# Patient Record
Sex: Female | Born: 1950
Health system: Southern US, Community
[De-identification: ages and names within clinical notes are randomized; demographics above are authoritative.]

## PROBLEM LIST (undated history)

## (undated) DIAGNOSIS — K5792 Diverticulitis of intestine, part unspecified, without perforation or abscess without bleeding: Secondary | ICD-10-CM

## (undated) HISTORY — PX: TARSAL TUNNEL RELEASE: SHX5042

---

## 1986-05-16 HISTORY — PX: OTHER SURGICAL HISTORY: SHX169

## 2003-08-12 ENCOUNTER — Encounter: Admission: RE | Admit: 2003-08-12 | Discharge: 2003-08-12 | Payer: Self-pay | Admitting: Orthopedic Surgery

## 2003-10-30 ENCOUNTER — Emergency Department (HOSPITAL_COMMUNITY): Admission: EM | Admit: 2003-10-30 | Discharge: 2003-10-30 | Payer: Self-pay | Admitting: Emergency Medicine

## 2004-06-03 ENCOUNTER — Ambulatory Visit (HOSPITAL_BASED_OUTPATIENT_CLINIC_OR_DEPARTMENT_OTHER): Admission: RE | Admit: 2004-06-03 | Discharge: 2004-06-03 | Payer: Self-pay | Admitting: Orthopedic Surgery

## 2013-09-07 ENCOUNTER — Ambulatory Visit: Payer: BC Managed Care – PPO | Admitting: Family Medicine

## 2013-09-07 VITALS — BP 130/84 | HR 79 | Temp 99.0°F | Resp 18 | Ht 65.0 in | Wt 177.6 lb

## 2013-09-07 DIAGNOSIS — Z8719 Personal history of other diseases of the digestive system: Secondary | ICD-10-CM | POA: Insufficient documentation

## 2013-09-07 DIAGNOSIS — K5792 Diverticulitis of intestine, part unspecified, without perforation or abscess without bleeding: Secondary | ICD-10-CM

## 2013-09-07 DIAGNOSIS — R109 Unspecified abdominal pain: Secondary | ICD-10-CM

## 2013-09-07 LAB — POCT CBC
Granulocyte percent: 65.8 %G (ref 37–80)
HCT, POC: 46 % (ref 37.7–47.9)
Hemoglobin: 15 g/dL (ref 12.2–16.2)
Lymph, poc: 2.3 (ref 0.6–3.4)
MCH: 28.8 pg (ref 27–31.2)
MCHC: 32.6 g/dL (ref 31.8–35.4)
MCV: 88.3 fL (ref 80–97)
MID (CBC): 0.5 (ref 0–0.9)
MPV: 10 fL (ref 0–99.8)
PLATELET COUNT, POC: 338 10*3/uL (ref 142–424)
POC Granulocyte: 5.5 (ref 2–6.9)
POC LYMPH %: 28.2 % (ref 10–50)
POC MID %: 6 %M (ref 0–12)
RBC: 5.21 M/uL (ref 4.04–5.48)
RDW, POC: 12.7 %
WBC: 8.3 10*3/uL (ref 4.6–10.2)

## 2013-09-07 LAB — POCT URINALYSIS DIPSTICK
Blood, UA: NEGATIVE
Glucose, UA: NEGATIVE
LEUKOCYTES UA: NEGATIVE
NITRITE UA: NEGATIVE
PROTEIN UA: 30
Spec Grav, UA: >=1.03
UROBILINOGEN UA: 1
pH, UA: 5.5

## 2013-09-07 LAB — POCT UA - MICROSCOPIC ONLY
Bacteria, U Microscopic: NEGATIVE
Casts, Ur, LPF, POC: NEGATIVE
Crystals, Ur, HPF, POC: NEGATIVE
YEAST UA: NEGATIVE

## 2013-09-07 MED ORDER — METRONIDAZOLE 500 MG PO TABS: 500.0000 mg | ORAL_TABLET | Freq: Two times a day (BID) | ORAL | Status: DC

## 2013-09-07 MED ORDER — CIPROFLOXACIN HCL 500 MG PO TABS: 500.0000 mg | ORAL_TABLET | Freq: Two times a day (BID) | ORAL | Status: DC

## 2013-09-07 NOTE — Progress Notes (Addendum)
Subjective:    Patient ID: Katie Blake, female    DOB: August 11, 1950, 63 y.o.   MRN: [id number]  Abdominal Pain Associated symptoms include constipation, dysuria and a fever. Pertinent negatives include no nausea or vomiting.   Chief Complaint  Patient presents with  . Abdominal Pain    lower, 1 week, getting worse    This chart was scribed for Katie StaggersJeffrey Latorya Bautch, MD by Katie Blake, ED Scribe. This patient was seen in room 11 and the patient's care was started at 6:00 PM.  HPI Comments: Katie Blake is a 63 y.o. female who presents to the [facility] complaining of worsening, left lower abdominal pain onset 1 week.She reports that the pain had gotten better 4 days ago but worsened in last 2-3 days. Pt reports fever, hot and cold symptoms, decreased appetite, minor dysuria, and constipation. She reports that she has had a very Blake BM a couple of days. Pt reports taking tylenol. She denies emesis and nausea. She reports that she had a  UTI many years ago but her symptoms today do not feel like UTI symptoms. Pt reports h/o diverticulitis. Pt reports that her symptoms feels similar. Pt denies colonoscpy.  There are no active problems to display for this patient.  History reviewed. No pertinent past medical history. History reviewed. No pertinent past surgical history. Allergies  Allergen Reactions  . Sulfa Antibiotics    Prior to Admission medications   Not on File   History   Social History  . Marital Status: Single    Spouse Name: N/A    Number of Children: N/A  . Years of Education: N/A   Occupational History  . Not on file.   Social History Main Topics  . Smoking status: Never Smoker   . Smokeless tobacco: Not on file  . Alcohol Use: Not on file  . Drug Use: Not on file  . Sexual Activity: Not on file   Other Topics Concern  . Not on file   Social History Narrative  . No narrative on file   Review of Systems  Constitutional: Positive for fever,  chills and appetite change.  Gastrointestinal: Positive for abdominal pain and constipation. Negative for nausea and vomiting.  Genitourinary: Positive for dysuria. Negative for difficulty urinating.       Objective:   Physical Exam  Nursing note and vitals reviewed. Constitutional: She is oriented to person, place, and time. She appears well-developed and well-nourished. No distress.  HENT:  Head: Normocephalic and atraumatic.  Eyes: EOM are normal.  Neck: Neck supple. No tracheal deviation present.  Cardiovascular: Normal rate, regular rhythm and normal heart sounds.  Exam reveals no gallop and no friction rub.   No murmur heard. Pulmonary/Chest: Effort normal and breath sounds normal. No respiratory distress. She has no wheezes. She has no rales.  Abdominal: Bowel sounds are normal. There is tenderness in the left lower quadrant. There is guarding ( LLQ). There is no CVA tenderness, no tenderness at McBurney's point and negative Murphy's sign.  Musculoskeletal: Normal range of motion.  Lumbar spine tenderness  Neurological: She is alert and oriented to person, place, and time.  Skin: Skin is warm, dry and intact. No rash noted.  Psychiatric: She has a normal mood and affect. Her behavior is normal.    Results for orders placed in visit on 09/07/13  POCT URINALYSIS DIPSTICK      Result Value Ref Range   Color, UA yellow  Clarity, UA clear     Glucose, UA neg     Bilirubin, UA Blake     Ketones, UA trace     Spec Grav, UA >=1.030     Blood, UA neg     pH, UA 5.5     Protein, UA 30     Urobilinogen, UA 1.0     Nitrite, UA neg     Leukocytes, UA Negative    POCT UA - MICROSCOPIC ONLY      Result Value Ref Range   WBC, Ur, HPF, POC 0-2     RBC, urine, microscopic 0-1     Bacteria, U Microscopic neg     Mucus, UA trace     Epithelial cells, urine per micros 0-4     Crystals, Ur, HPF, POC neg     Casts, Ur, LPF, POC neg     Yeast, UA neg    POCT CBC      Result Value  Ref Range   WBC 8.3  4.6 - 10.2 K/uL   Lymph, poc 2.3  0.6 - 3.4   POC LYMPH PERCENT 28.2  10 - 50 %L   MID (cbc) 0.5  0 - 0.9   POC MID % 6.0  0 - 12 %M   POC Granulocyte 5.5  2 - 6.9   Granulocyte percent 65.8  37 - 80 %G   RBC 5.21  4.04 - 5.48 M/uL   Hemoglobin 15.0  12.2 - 16.2 g/dL   HCT, POC 16.1  09.6 - 47.9 %   MCV 88.3  80 - 97 fL   MCH, POC 28.8  27 - 31.2 pg   MCHC 32.6  31.8 - 35.4 g/dL   RDW, POC 04.5     Platelet Count, POC 338  142 - 424 K/uL   MPV 10.0  0 - 99.8 fL       Assessment & Plan:   Katie Blake is a 63 y.o. female Abdominal pain - Plan: POCT urinalysis dipstick, POCT UA - Microscopic Only, POCT CBC, ciprofloxacin (CIPRO) 500 MG tablet, metroNIDAZOLE (FLAGYL) 500 MG tablet  Diverticulitis - Plan: ciprofloxacin (CIPRO) 500 MG tablet, metroNIDAZOLE (FLAGYL) 500 MG tablet Suspected early/mild diverticulitis. Start cipro/flagyl and recheck in next 48-72hrs if not much improved as may need CT. Sooner if fever, incr pain or other worsening (RTC/ER precautions given).   Meds ordered this encounter  Medications  . ciprofloxacin (CIPRO) 500 MG tablet    Sig: Take 1 tablet (500 mg total) by mouth 2 (two) times daily.    Dispense:  20 tablet    Refill:  0  . metroNIDAZOLE (FLAGYL) 500 MG tablet    Sig: Take 1 tablet (500 mg total) by mouth 2 (two) times daily.    Dispense:  20 tablet    Refill:  0   Patient Instructions  Start the antibiotics as discussed for probable diverticulitis. If not improving in the next 48-72 hours - return for recheck as we may need to obtain a cat scan to further evaluate your symptoms. Return to the clinic or go to the nearest emergency room if any of your symptoms worsen or new symptoms occur, including but not limited to fever, vomiting or worsening abdominal pain.   After the current symptoms improve - follow up with primary care provider or gastroenterologist.   Diverticulitis A diverticulum is a Blake pouch or sac on the  colon. Diverticulosis is the presence of these diverticula on the colon.  Diverticulitis is the irritation (inflammation) or infection of diverticula. CAUSES  The colon and its diverticula contain bacteria. If food particles block the tiny opening to a diverticulum, the bacteria inside can grow and cause an increase in pressure. This leads to infection and inflammation and is called diverticulitis. SYMPTOMS   Abdominal pain and tenderness. Usually, the pain is located on the left side of your abdomen. However, it could be located elsewhere.  Fever.  Bloating.  Feeling sick to your stomach (nausea).  Throwing up (vomiting).  Abnormal stools. DIAGNOSIS  Your caregiver will take a history and perform a physical exam. Since many things can cause abdominal pain, other tests may be necessary. Tests may include:  Blood tests.  Urine tests.  X-ray of the abdomen.  CT scan of the abdomen. Sometimes, surgery is needed to determine if diverticulitis or other conditions are causing your symptoms. TREATMENT  Most of the time, you can be treated without surgery. Treatment includes:  Resting the bowels by only having liquids for a few days. As you improve, you will need to eat a low-fiber diet.  Intravenous (IV) fluids if you are losing body fluids (dehydrated).  Antibiotic medicines that treat infections may be given.  Pain and nausea medicine, if needed.  Surgery if the inflamed diverticulum has burst. HOME CARE INSTRUCTIONS   Try a clear liquid diet (broth, tea, or water for as long as directed by your caregiver). You may then gradually begin a low-fiber diet as tolerated.  A low-fiber diet is a diet with less than 10 grams of fiber. Choose the foods below to reduce fiber in the diet:  White breads, cereals, rice, and pasta.  Cooked fruits and vegetables or soft fresh fruits and vegetables without the skin.  Ground or well-cooked tender beef, ham, veal, lamb, pork, or  poultry.  Eggs and seafood.  After your diverticulitis symptoms have improved, your caregiver may put you on a high-fiber diet. A high-fiber diet includes 14 grams of fiber for every 1000 calories consumed. For a standard 2000 calorie diet, you would need 28 grams of fiber. Follow these diet guidelines to help you increase the fiber in your diet. It is important to slowly increase the amount fiber in your diet to avoid gas, constipation, and bloating.  Choose whole-grain breads, cereals, pasta, and brown rice.  Choose fresh fruits and vegetables with the skin on. Do not overcook vegetables because the more vegetables are cooked, the more fiber is lost.  Choose more nuts, seeds, legumes, dried peas, beans, and lentils.  Look for food products that have greater than 3 grams of fiber per serving on the Nutrition Facts label.  Take all medicine as directed by your caregiver.  If your caregiver has given you a follow-up appointment, it is very important that you go. Not going could result in lasting (chronic) or permanent injury, pain, and disability. If there is any problem keeping the appointment, call to reschedule. SEEK MEDICAL CARE IF:   Your pain does not improve.  You have a hard time advancing your diet beyond clear liquids.  Your bowel movements do not return to normal. SEEK IMMEDIATE MEDICAL CARE IF:   Your pain becomes worse.  You have an oral temperature above 102 F (38.9 C), not controlled by medicine.  You have repeated vomiting.  You have bloody or black, tarry stools.  Symptoms that brought you to your caregiver become worse or are not getting better. MAKE SURE YOU:   Understand these instructions.  Will watch your condition.  Will get help right away if you are not doing well or get worse. Document Released: 02/09/2005 Document Revised: 07/25/2011 Document Reviewed: 06/07/2010 [facility] Patient Information 2014 [place], [place].       I personally  performed the services described in this documentation, which was scribed in my presence. The recorded information has been reviewed and considered, and addended by me as needed.

## 2013-09-07 NOTE — Patient Instructions (Signed)
Start the antibiotics as discussed for probable diverticulitis. If not improving in the next 48-72 hours - return for recheck as we may need to obtain a cat scan to further evaluate your symptoms. Return to the clinic or go to the nearest emergency room if any of your symptoms worsen or new symptoms occur, including but not limited to fever, vomiting or worsening abdominal pain.   After the current symptoms improve - follow up with primary care provider or gastroenterologist.   Diverticulitis A diverticulum is a small pouch or sac on the colon. Diverticulosis is the presence of these diverticula on the colon. Diverticulitis is the irritation (inflammation) or infection of diverticula. CAUSES  The colon and its diverticula contain bacteria. If food particles block the tiny opening to a diverticulum, the bacteria inside can grow and cause an increase in pressure. This leads to infection and inflammation and is called diverticulitis. SYMPTOMS   Abdominal pain and tenderness. Usually, the pain is located on the left side of your abdomen. However, it could be located elsewhere.  Fever.  Bloating.  Feeling sick to your stomach (nausea).  Throwing up (vomiting).  Abnormal stools. DIAGNOSIS  Your caregiver will take a history and perform a physical exam. Since many things can cause abdominal pain, other tests may be necessary. Tests may include:  Blood tests.  Urine tests.  X-ray of the abdomen.  CT scan of the abdomen. Sometimes, surgery is needed to determine if diverticulitis or other conditions are causing your symptoms. TREATMENT  Most of the time, you can be treated without surgery. Treatment includes:  Resting the bowels by only having liquids for a few days. As you improve, you will need to eat a low-fiber diet.  Intravenous (IV) fluids if you are losing body fluids (dehydrated).  Antibiotic medicines that treat infections may be given.  Pain and nausea medicine, if  needed.  Surgery if the inflamed diverticulum has burst. HOME CARE INSTRUCTIONS   Try a clear liquid diet (broth, tea, or water for as long as directed by your caregiver). You may then gradually begin a low-fiber diet as tolerated.  A low-fiber diet is a diet with less than 10 grams of fiber. Choose the foods below to reduce fiber in the diet:  White breads, cereals, rice, and pasta.  Cooked fruits and vegetables or soft fresh fruits and vegetables without the skin.  Ground or well-cooked tender beef, ham, veal, lamb, pork, or poultry.  Eggs and seafood.  After your diverticulitis symptoms have improved, your caregiver may put you on a high-fiber diet. A high-fiber diet includes 14 grams of fiber for every 1000 calories consumed. For a standard 2000 calorie diet, you would need 28 grams of fiber. Follow these diet guidelines to help you increase the fiber in your diet. It is important to slowly increase the amount fiber in your diet to avoid gas, constipation, and bloating.  Choose whole-grain breads, cereals, pasta, and brown rice.  Choose fresh fruits and vegetables with the skin on. Do not overcook vegetables because the more vegetables are cooked, the more fiber is lost.  Choose more nuts, seeds, legumes, dried peas, beans, and lentils.  Look for food products that have greater than 3 grams of fiber per serving on the Nutrition Facts label.  Take all medicine as directed by your caregiver.  If your caregiver has given you a follow-up appointment, it is very important that you go. Not going could result in lasting (chronic) or permanent injury, pain, and  disability. If there is any problem keeping the appointment, call to reschedule. SEEK MEDICAL CARE IF:   Your pain does not improve.  You have a hard time advancing your diet beyond clear liquids.  Your bowel movements do not return to normal. SEEK IMMEDIATE MEDICAL CARE IF:   Your pain becomes worse.  You have an oral  temperature above 102 F (38.9 C), not controlled by medicine.  You have repeated vomiting.  You have bloody or black, tarry stools.  Symptoms that brought you to your caregiver become worse or are not getting better. MAKE SURE YOU:   Understand these instructions.  Will watch your condition.  Will get help right away if you are not doing well or get worse. Document Released: 02/09/2005 Document Revised: 07/25/2011 Document Reviewed: 06/07/2010 [facility] Patient Information 2014 [place], [place].

## 2013-09-09 ENCOUNTER — Ambulatory Visit: Payer: BC Managed Care – PPO

## 2013-09-17 ENCOUNTER — Ambulatory Visit: Payer: BC Managed Care – PPO

## 2013-09-17 ENCOUNTER — Other Ambulatory Visit: Payer: Self-pay | Admitting: Family Medicine

## 2013-09-17 ENCOUNTER — Ambulatory Visit: Payer: BC Managed Care – PPO | Admitting: Family Medicine

## 2013-09-17 VITALS — BP 119/78 | HR 81 | Temp 98.7°F | Resp 16 | Ht 65.5 in | Wt 173.0 lb

## 2013-09-17 DIAGNOSIS — R109 Unspecified abdominal pain: Secondary | ICD-10-CM

## 2013-09-17 DIAGNOSIS — R197 Diarrhea, unspecified: Secondary | ICD-10-CM

## 2013-09-17 DIAGNOSIS — R110 Nausea: Secondary | ICD-10-CM

## 2013-09-17 LAB — POCT CBC
Granulocyte percent: 65.8 % (ref 37–80)
HCT, POC: 42 % (ref 37.7–47.9)
Hemoglobin: 13.8 g/dL (ref 12.2–16.2)
Lymph, poc: 2.1 (ref 0.6–3.4)
MCH, POC: 28.8 pg (ref 27–31.2)
MCHC: 32.9 g/dL (ref 31.8–35.4)
MCV: 87.5 fL (ref 80–97)
MID (cbc): 0.6 (ref 0–0.9)
MPV: 10.1 fL (ref 0–99.8)
POC Granulocyte: 5.2 (ref 2–6.9)
POC LYMPH PERCENT: 26.9 % (ref 10–50)
POC MID %: 7.3 %M (ref 0–12)
Platelet Count, POC: 378 10*3/uL (ref 142–424)
RBC: 4.8 M/uL (ref 4.04–5.48)
RDW, POC: 13.4 %
WBC: 7.9 10*3/uL (ref 4.6–10.2)

## 2013-09-17 LAB — COMPREHENSIVE METABOLIC PANEL
BUN: 13 mg/dL (ref 6–23)
CO2: 27 mEq/L (ref 19–32)
Calcium: 9 mg/dL (ref 8.4–10.5)
Chloride: 104 mEq/L (ref 96–112)
Creat: 0.77 mg/dL (ref 0.50–1.10)
Total Bilirubin: 0.5 mg/dL (ref 0.2–1.2)

## 2013-09-17 LAB — COMPREHENSIVE METABOLIC PANEL WITH GFR
ALT: 9 U/L (ref 0–35)
AST: 11 U/L (ref 0–37)
Albumin: 3.8 g/dL (ref 3.5–5.2)
Alkaline Phosphatase: 59 U/L (ref 39–117)
Glucose, Bld: 107 mg/dL — ABNORMAL HIGH (ref 70–99)
Potassium: 3.8 meq/L (ref 3.5–5.3)
Sodium: 141 meq/L (ref 135–145)
Total Protein: 7 g/dL (ref 6.0–8.3)

## 2013-09-17 MED ORDER — ONDANSETRON HCL 4 MG PO TABS: 4.0000 mg | ORAL_TABLET | Freq: Three times a day (TID) | ORAL | Status: DC | PRN

## 2013-09-17 NOTE — Progress Notes (Signed)
Chief Complaint:  Chief Complaint  Patient presents with  . Abdominal Pain    was treated for diverticulitis, finished abx today.    HPI: Katie Blake is a 63 y.o. female who is here for  Recheck on diverticular disease, was having LLQ abd pain and was seen here on 09/07/13, she has been on cipro and flagyl BID x 10 days, still has sxs but abd more diffuse  She was dx with presumptive diverticulitis/losis. She ate popcorn around that time, which she normally never does. She started having diarrhea once she took medicine, yesterday 2 episodes, today abput 4.N onbloody loose stools.  She was seen in [place] years back and was dx with diverticulitis and was gien abx and went away in 2 days, This time it has not gone away.  Can't eat or drink,  Poor po intake, can get nauseated, has not vomitted, drinksregular coke and feels better She denies diarrhea with her prior diverticular  flare ups.  Last OV, CBC and UA WNL No prior CT scan, no prior colonscopies No prior abdominal surgeries She was dx clinically with diverticualr dz  No past medical history on file. No past surgical history on file. History   Social History  . Marital Status: Single    Spouse Name: N/A    Number of Children: N/A  . Years of Education: N/A   Social History Main Topics  . Smoking status: Never Smoker   . Smokeless tobacco: Not on file  . Alcohol Use: Not on file  . Drug Use: Not on file  . Sexual Activity: Not on file   Other Topics Concern  . Not on file   Social History Narrative  . No narrative on file   Family History  Problem Relation Age of Onset  . Diabetes Mother   . Cancer Father    Allergies  Allergen Reactions  . Sulfa Antibiotics    Prior to Admission medications   Medication Sig Start Date End Date Taking? Authorizing Provider  ciprofloxacin (CIPRO) 500 MG tablet Take 1 tablet (500 mg total) by mouth 2 (two) times daily. 09/07/13   Shade FloodJeffrey R Greene, MD     ROS: The  patient denies fevers, chills, night sweats, unintentional weight loss, chest pain, palpitations, wheezing, dyspnea on exertion, nausea, vomiting, abdominal pain, dysuria, hematuria, melena, numbness, or tingling.   All other systems have been reviewed and were otherwise negative with the exception of those mentioned in the HPI and as above.    PHYSICAL EXAM: Filed Vitals:   09/17/13 1651  BP: 119/78  Pulse: 81  Temp: 98.7 F (37.1 C)  Resp: 16   Filed Vitals:   09/17/13 1651  Height: 5' 5.5" (1.664 m)  Weight: 173 lb (78.472 kg)   Body mass index is 28.34 kg/(m^2).  General: Alert, no acute distress HEENT:  Normocephalic, atraumatic, oropharynx patent. EOMI, PERRLA Cardiovascular:  Regular rate and rhythm, no rubs murmurs or gallops.  No Carotid bruits, radial pulse intact. No pedal edema.  Respiratory: Clear to auscultation bilaterally.  No wheezes, rales, or rhonchi.  No cyanosis, no use of accessory musculature GI: No organomegaly, abdomen is soft and non-tender, positive bowel sounds.  No masses. Skin: No rashes. Neurologic: Facial musculature symmetric. Psychiatric: Patient is appropriate throughout our interaction. Lymphatic: No cervical lymphadenopathy Musculoskeletal: Gait intact.   LABS: Results for orders placed in visit on 09/17/13  POCT CBC      Result Value Ref Range  WBC 7.9  4.6 - 10.2 K/uL   Lymph, poc 2.1  0.6 - 3.4   POC LYMPH PERCENT 26.9  10 - 50 %L   MID (cbc) 0.6  0 - 0.9   POC MID % 7.3  0 - 12 %M   POC Granulocyte 5.2  2 - 6.9   Granulocyte percent 65.8  37 - 80 %G   RBC 4.80  4.04 - 5.48 M/uL   Hemoglobin 13.8  12.2 - 16.2 g/dL   HCT, POC 16.142.0  09.637.7 - 47.9 %   MCV 87.5  80 - 97 fL   MCH, POC 28.8  27 - 31.2 pg   MCHC 32.9  31.8 - 35.4 g/dL   RDW, POC 04.513.4     Platelet Count, POC 378  142 - 424 K/uL   MPV 10.1  0 - 99.8 fL     EKG/XRAY:   [facility] Nonspecific gas patterns + pepto use No acute  cardiompulmoary process   ASSESSMENT/PLAN: Encounter Diagnoses  Name Primary?  . Diarrhea Yes  . Nausea alone   . Abdominal pain, unspecified site    Rx Zofran for nausea, may help with loose stools as well Await C diff and CMP She decline CT scan or referral for screening colonoscopy referral F/u prn  Gross sideeffects, risk and benefits, and alternatives of medications d/w patient. Patient is aware that all medications have potential sideeffects and we are unable to predict every sideeffect or drug-drug interaction that may occur.  Lenell Antuhao P Le, DO 09/17/2013 5:41 PM

## 2013-09-17 NOTE — Patient Instructions (Signed)
Abdominal Pain, Adult °Many things can cause abdominal pain. Usually, abdominal pain is not caused by a disease and will improve without treatment. It can often be observed and treated at home. Your health care provider will do a physical exam and possibly order blood tests and X-rays to help determine the seriousness of your pain. However, in many cases, more time must pass before a clear cause of the pain can be found. Before that point, your health care provider may not know if you need more testing or further treatment. °HOME CARE INSTRUCTIONS  °Monitor your abdominal pain for any changes. The following actions may help to alleviate any discomfort you are experiencing: °· Only take over-the-counter or prescription medicines as directed by your health care provider. °· Do not take laxatives unless directed to do so by your health care provider. °· Try a clear liquid diet (broth, tea, or water) as directed by your health care provider. Slowly move to a bland diet as tolerated. °SEEK MEDICAL CARE IF: °· You have unexplained abdominal pain. °· You have abdominal pain associated with nausea or diarrhea. °· You have pain when you urinate or have a bowel movement. °· You experience abdominal pain that wakes you in the night. °· You have abdominal pain that is worsened or improved by eating food. °· You have abdominal pain that is worsened with eating fatty foods. °SEEK IMMEDIATE MEDICAL CARE IF:  °· Your pain does not go away within 2 hours. °· You have a fever. °· You keep throwing up (vomiting). °· Your pain is felt only in portions of the abdomen, such as the right side or the left lower portion of the abdomen. °· You pass bloody or black tarry stools. °MAKE SURE YOU: °· Understand these instructions.   °· Will watch your condition.   °· Will get help right away if you are not doing well or get worse.   °Document Released: 02/09/2005 Document Revised: 02/20/2013 Document Reviewed: 01/09/2013 °ExitCare® Patient  Information ©2014 [employer]. ° °

## 2013-09-18 ENCOUNTER — Telehealth: Payer: Self-pay | Admitting: Family Medicine

## 2013-09-18 LAB — C. DIFFICILE GDH AND TOXIN A/B
C. difficile GDH: NOT DETECTED
C. difficile Toxin A/B: NOT DETECTED

## 2013-09-18 NOTE — Telephone Encounter (Signed)
Spoke to patient about labs, feels better, no diarrhea since on zofran, able to take in po

## 2013-09-26 ENCOUNTER — Encounter: Payer: Self-pay | Admitting: Family Medicine

## 2013-10-06 ENCOUNTER — Telehealth: Payer: Self-pay

## 2013-10-06 ENCOUNTER — Ambulatory Visit: Payer: BC Managed Care – PPO | Admitting: Family Medicine

## 2013-10-06 VITALS — BP 112/74 | HR 70 | Temp 98.5°F | Resp 16 | Ht 65.5 in | Wt 171.0 lb

## 2013-10-06 DIAGNOSIS — K5792 Diverticulitis of intestine, part unspecified, without perforation or abscess without bleeding: Secondary | ICD-10-CM

## 2013-10-06 DIAGNOSIS — K5732 Diverticulitis of large intestine without perforation or abscess without bleeding: Secondary | ICD-10-CM

## 2013-10-06 LAB — POCT CBC
Granulocyte percent: 64.7 %G (ref 37–80)
HCT, POC: 41.6 % (ref 37.7–47.9)
Hemoglobin: 13.6 g/dL (ref 12.2–16.2)
Lymph, poc: 2.4 (ref 0.6–3.4)
MCH, POC: 28.4 pg (ref 27–31.2)
MCHC: 32.7 g/dL (ref 31.8–35.4)
MCV: 86.9 fL (ref 80–97)
MID (cbc): 0.4 (ref 0–0.9)
MPV: 9.8 fL (ref 0–99.8)
POC Granulocyte: 5.2 (ref 2–6.9)
POC LYMPH PERCENT: 30.2 %L (ref 10–50)
POC MID %: 5.1 %M (ref 0–12)
Platelet Count, POC: 238 10*3/uL (ref 142–424)
RBC: 4.79 M/uL (ref 4.04–5.48)
RDW, POC: 13.4 %
WBC: 8 10*3/uL (ref 4.6–10.2)

## 2013-10-06 MED ORDER — AMOXICILLIN-POT CLAVULANATE 875-125 MG PO TABS: 1.0000 | ORAL_TABLET | Freq: Two times a day (BID) | ORAL | Status: DC

## 2013-10-06 NOTE — Telephone Encounter (Signed)
PATIENT IS CALLING TO SEE IF SHE CAN TRY A DIFFERENT MEDICINE WITHOUT HAVING TO MAKE AN OFFICE VISIT. SHE IS CURRENTLY TAKING CIPROFLOXAVIN AND METRONODAZOLE, WHICH GIVES HER DIARREAH, AND ONDANSETRON WHICH STOPS THE DIARRHEA BUT DOESN'T HELP WITH ANYTHING ELSE.  PLEASE CALL PATIENT: [phone]

## 2013-10-06 NOTE — Progress Notes (Signed)
   Subjective:    Patient ID: Katie Blake, female    DOB: [birth year], 63 y.o.   MRN: [id number]  HPI 63 yo woman with left lower quadrant abdominal pain x 4-6 weeks, initally given cipro and flagyl, then zofran.  She's had some diarrhea lately (last week) and the loss of appetite is really bothering her  Patient's been sick for 2 months. She's not been able to eat very well although at times the nausea is less than others. She's been having increasing abdominal pain in the left lower quadrant over the last 48 hours and intensity is becoming unbearable.  Patient remains nauseated.   Review of Systems     Objective:   Physical Exam no acute distress HEENT: Unremarkable Chest: Clear  Heart: Regular Abdomen: Soft, guarding left lower quadrant along with rebound to that left lower quadrant as well. No mass palpable, no HSM Skin: No jaundice, no rashes Extremities: No Results for orders placed in visit on 09/17/13  C. DIFFICILE GDH AND TOXIN A/B      Result Value Ref Range   C. difficile GDH Not Detected     C. difficile Toxin A/B Not Detected     Source Stool     Wt Readings from Last 3 Encounters:  10/06/13 171 lb (77.565 kg)  09/17/13 173 lb (78.472 kg)  09/07/13 177 lb 9.6 oz (80.559 kg)        Assessment & Plan:

## 2013-10-06 NOTE — Patient Instructions (Addendum)
I want you to take probiotics twice daily to prevent antibiotic associated diarrhea   Diverticulitis A diverticulum is a small pouch or sac on the colon. Diverticulosis is the presence of these diverticula on the colon. Diverticulitis is the irritation (inflammation) or infection of diverticula. CAUSES  The colon and its diverticula contain bacteria. If food particles block the tiny opening to a diverticulum, the bacteria inside can grow and cause an increase in pressure. This leads to infection and inflammation and is called diverticulitis. SYMPTOMS   Abdominal pain and tenderness. Usually, the pain is located on the left side of your abdomen. However, it could be located elsewhere.  Fever.  Bloating.  Feeling sick to your stomach (nausea).  Throwing up (vomiting).  Abnormal stools. DIAGNOSIS  Your caregiver will take a history and perform a physical exam. Since many things can cause abdominal pain, other tests may be necessary. Tests may include:  Blood tests.  Urine tests.  X-ray of the abdomen.  CT scan of the abdomen. Sometimes, surgery is needed to determine if diverticulitis or other conditions are causing your symptoms. TREATMENT  Most of the time, you can be treated without surgery. Treatment includes:  Resting the bowels by only having liquids for a few days. As you improve, you will need to eat a low-fiber diet.  Intravenous (IV) fluids if you are losing body fluids (dehydrated).  Antibiotic medicines that treat infections may be given.  Pain and nausea medicine, if needed.  Surgery if the inflamed diverticulum has burst. HOME CARE INSTRUCTIONS   Try a clear liquid diet (broth, tea, or water for as long as directed by your caregiver). You may then gradually begin a low-fiber diet as tolerated.  A low-fiber diet is a diet with less than 10 grams of fiber. Choose the foods below to reduce fiber in the diet:  White breads, cereals, rice, and pasta.  Cooked  fruits and vegetables or soft fresh fruits and vegetables without the skin.  Ground or well-cooked tender beef, ham, veal, lamb, pork, or poultry.  Eggs and seafood.  After your diverticulitis symptoms have improved, your caregiver may put you on a high-fiber diet. A high-fiber diet includes 14 grams of fiber for every 1000 calories consumed. For a standard 2000 calorie diet, you would need 28 grams of fiber. Follow these diet guidelines to help you increase the fiber in your diet. It is important to slowly increase the amount fiber in your diet to avoid gas, constipation, and bloating.  Choose whole-grain breads, cereals, pasta, and brown rice.  Choose fresh fruits and vegetables with the skin on. Do not overcook vegetables because the more vegetables are cooked, the more fiber is lost.  Choose more nuts, seeds, legumes, dried peas, beans, and lentils.  Look for food products that have greater than 3 grams of fiber per serving on the Nutrition Facts label.  Take all medicine as directed by your caregiver.  If your caregiver has given you a follow-up appointment, it is very important that you go. Not going could result in lasting (chronic) or permanent injury, pain, and disability. If there is any problem keeping the appointment, call to reschedule. SEEK MEDICAL CARE IF:   Your pain does not improve.  You have a hard time advancing your diet beyond clear liquids.  Your bowel movements do not return to normal. SEEK IMMEDIATE MEDICAL CARE IF:   Your pain becomes worse.  You have an oral temperature above 102 F (38.9 C), not controlled by  medicine.  You have repeated vomiting.  You have bloody or black, tarry stools.  Symptoms that brought you to your caregiver become worse or are not getting better. MAKE SURE YOU:   Understand these instructions.  Will watch your condition.  Will get help right away if you are not doing well or get worse. Document Released: 02/09/2005  Document Revised: 07/25/2011 Document Reviewed: 06/07/2010 [facility] Patient Information 2014 [place], [place].

## 2013-10-07 LAB — SEDIMENTATION RATE: Sed Rate: 21 mm/hr (ref 0–22)

## 2013-10-07 NOTE — Telephone Encounter (Signed)
Patient states that she was seen yesterday and was given a new abx and probiotics.  She states that she is feeling alittle better than yesterday.  She will call back in a couple days if not much better.

## 2013-10-07 NOTE — Telephone Encounter (Signed)
Will you please call and get further information.  I wonder if pt has been seen since this phone message.  Pt is being treated for diverticulitis.  From the chart it looks like she is being treated with Augmentin.

## 2015-10-28 IMAGING — CR DG ABDOMEN ACUTE W/ 1V CHEST
3 series · 3 of 3 positions shown · non-contrast
Comparison: None.

CLINICAL DATA: Abdominal pain

EXAM:
ACUTE ABDOMEN SERIES (ABDOMEN 2 VIEW & CHEST 1 VIEW)

[PA]
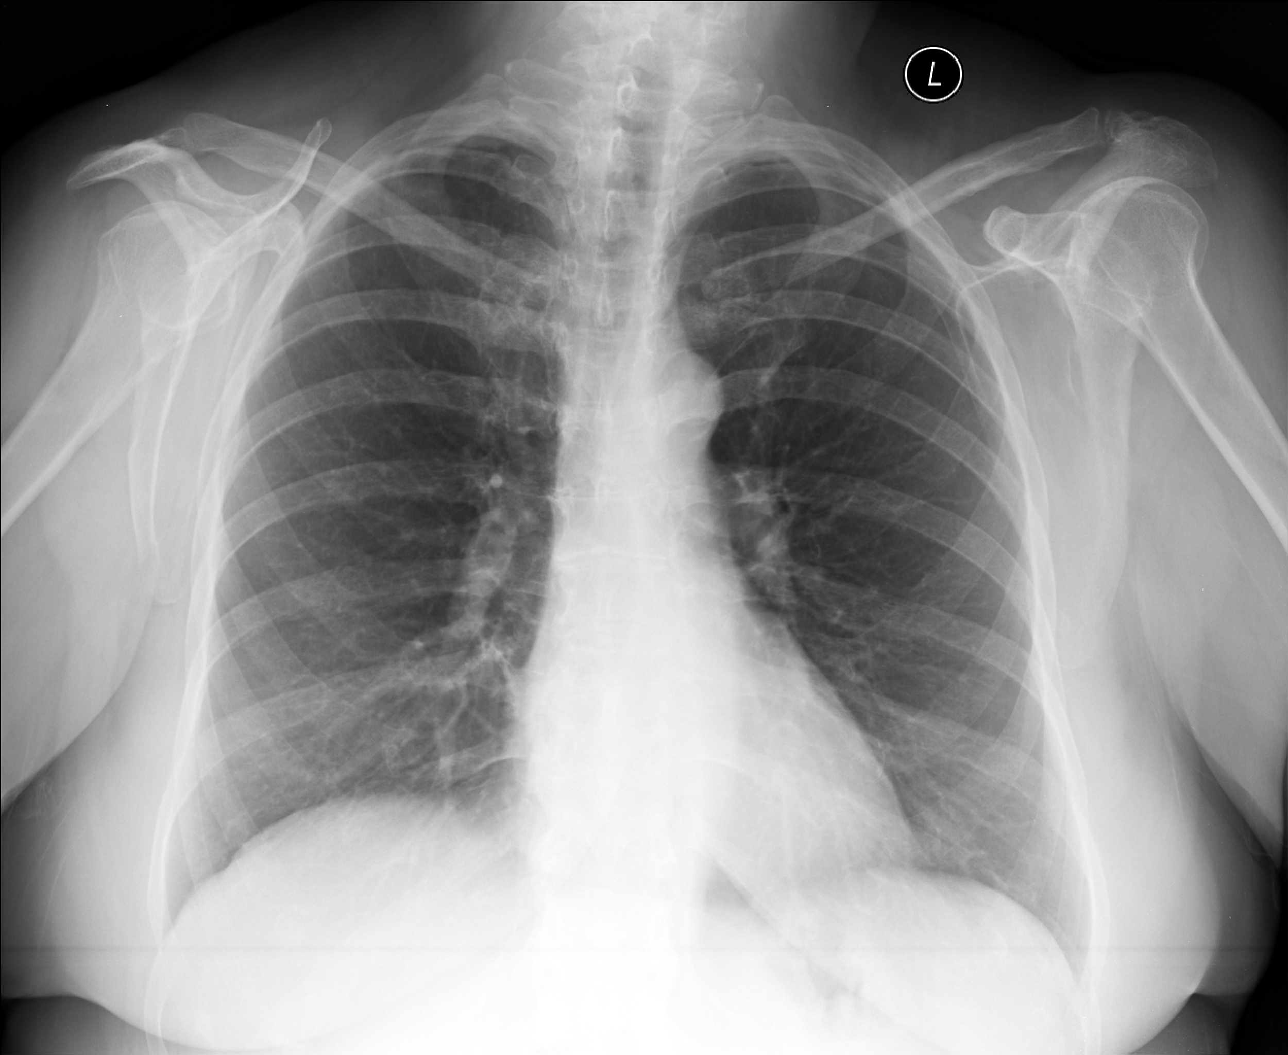

[AP (1 of 2)]
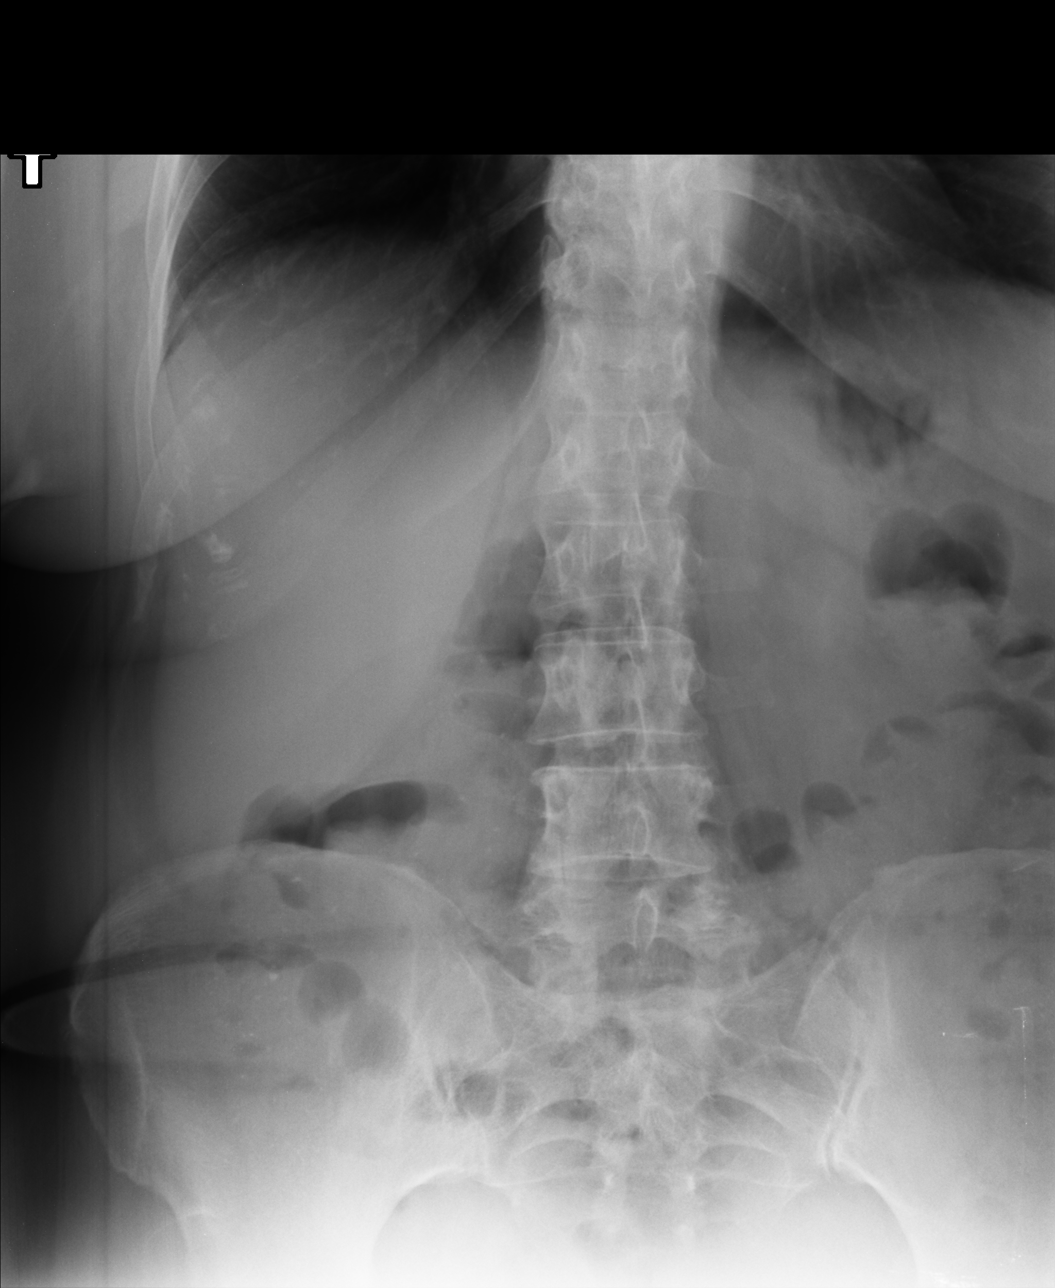

[AP (2 of 2)]
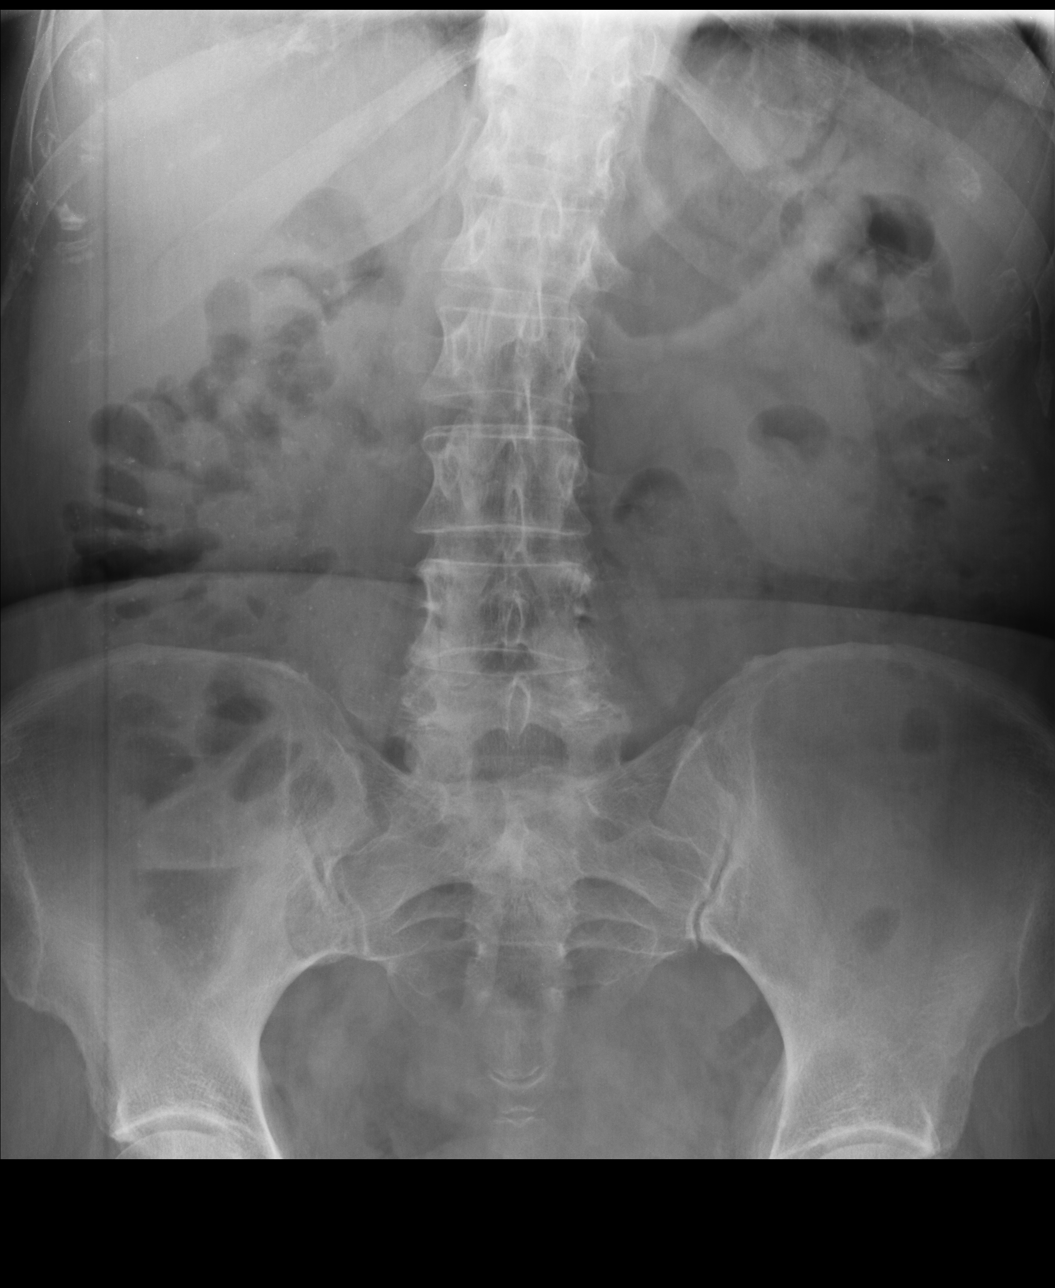

[3 of 3 positions shown; findings below may reference images not displayed]

FINDINGS: There is no evidence of dilated bowel loops or free intraperitoneal
air. No radiopaque calculi or other significant radiographic
abnormality is seen. Intrauterine device is noted within the pelvis.
Heart size and mediastinal contours are within normal limits. Both
lungs are clear.
IMPRESSION: Negative abdominal radiographs.  No acute cardiopulmonary disease.

## 2016-08-18 ENCOUNTER — Ambulatory Visit (INDEPENDENT_AMBULATORY_CARE_PROVIDER_SITE_OTHER): Payer: 59 | Admitting: Family Medicine

## 2016-08-18 ENCOUNTER — Encounter: Payer: Self-pay | Admitting: Family Medicine

## 2016-08-18 VITALS — BP 118/80 | HR 67 | Temp 98.5°F | Ht 65.5 in | Wt 178.8 lb

## 2016-08-18 DIAGNOSIS — Z130 Encounter for screening for diseases of the blood and blood-forming organs and certain disorders involving the immune mechanism: Secondary | ICD-10-CM

## 2016-08-18 DIAGNOSIS — E348 Other specified endocrine disorders: Secondary | ICD-10-CM | POA: Diagnosis not present

## 2016-08-18 DIAGNOSIS — Z1231 Encounter for screening mammogram for malignant neoplasm of breast: Secondary | ICD-10-CM | POA: Diagnosis not present

## 2016-08-18 DIAGNOSIS — Z0000 Encounter for general adult medical examination without abnormal findings: Secondary | ICD-10-CM

## 2016-08-18 DIAGNOSIS — Z1329 Encounter for screening for other suspected endocrine disorder: Secondary | ICD-10-CM

## 2016-08-18 DIAGNOSIS — Z1239 Encounter for other screening for malignant neoplasm of breast: Secondary | ICD-10-CM

## 2016-08-18 DIAGNOSIS — R9431 Abnormal electrocardiogram [ECG] [EKG]: Secondary | ICD-10-CM

## 2016-08-18 DIAGNOSIS — Z13220 Encounter for screening for lipoid disorders: Secondary | ICD-10-CM

## 2016-08-18 DIAGNOSIS — G43009 Migraine without aura, not intractable, without status migrainosus: Secondary | ICD-10-CM | POA: Diagnosis not present

## 2016-08-18 LAB — COMPREHENSIVE METABOLIC PANEL
ALK PHOS: 55 U/L (ref 39–117)
ALT: 12 U/L (ref 0–35)
AST: 16 U/L (ref 0–37)
Albumin: 4.3 g/dL (ref 3.5–5.2)
BILIRUBIN TOTAL: 0.5 mg/dL (ref 0.2–1.2)
BUN: 15 mg/dL (ref 6–23)
CO2: 29 meq/L (ref 19–32)
Calcium: 9.4 mg/dL (ref 8.4–10.5)
Chloride: 109 mEq/L (ref 96–112)
Creatinine, Ser: 0.87 mg/dL (ref 0.40–1.20)
GFR: 69.37 mL/min (ref >60.00)
GLUCOSE: 96 mg/dL (ref 70–99)
Potassium: 4.3 mEq/L (ref 3.5–5.1)
SODIUM: 143 meq/L (ref 135–145)
TOTAL PROTEIN: 7.3 g/dL (ref 6.0–8.3)

## 2016-08-18 LAB — TSH: TSH: 4.07 u[IU]/mL (ref 0.35–4.50)

## 2016-08-18 LAB — CBC WITH DIFFERENTIAL/PLATELET
BASOS ABS: 0.1 10*3/uL (ref 0.0–0.1)
BASOS PCT: 1.2 % (ref 0.0–3.0)
Eosinophils Absolute: 0.1 10*3/uL (ref 0.0–0.7)
Eosinophils Relative: 1.3 % (ref 0.0–5.0)
HEMATOCRIT: 45.3 % (ref 36.0–46.0)
Hemoglobin: 15.3 g/dL — ABNORMAL HIGH (ref 12.0–15.0)
LYMPHS ABS: 2.4 10*3/uL (ref 0.7–4.0)
LYMPHS PCT: 47.6 % — AB (ref 12.0–46.0)
MCHC: 33.8 g/dL (ref 30.0–36.0)
MCV: 85.7 fl (ref 78.0–100.0)
MONOS PCT: 7.6 % (ref 3.0–12.0)
Monocytes Absolute: 0.4 10*3/uL (ref 0.1–1.0)
NEUTROS ABS: 2.1 10*3/uL (ref 1.4–7.7)
NEUTROS PCT: 42.3 % — AB (ref 43.0–77.0)
PLATELETS: 196 10*3/uL (ref 150.0–400.0)
RBC: 5.29 Mil/uL — ABNORMAL HIGH (ref 3.87–5.11)
RDW: 13.4 % (ref 11.5–15.5)
WBC: 5.1 10*3/uL (ref 4.0–10.5)

## 2016-08-18 LAB — LIPID PANEL
CHOL/HDL RATIO: 5
Cholesterol: 227 mg/dL — ABNORMAL HIGH (ref 0–200)
HDL: 47.2 mg/dL (ref >39.00)
LDL Cholesterol: 153 mg/dL — ABNORMAL HIGH (ref 0–99)
NONHDL: 179.61
Triglycerides: 134 mg/dL (ref 0.0–149.0)
VLDL: 26.8 mg/dL (ref 0.0–40.0)

## 2016-08-18 NOTE — Assessment & Plan Note (Signed)
Rare, not on prophylaxis.

## 2016-08-18 NOTE — Progress Notes (Signed)
Pre visit review using our clinic review tool, if applicable. No additional management support is needed unless otherwise documented below in the visit note. 

## 2016-08-18 NOTE — Progress Notes (Signed)
Subjective:    Patient ID: Katie Blake, female    DOB: 04-15-1951, 66 y.o.   MRN: [id number]  HPI    66 year old female presents to establish care.  She occ in past went to [facility]. She has never had a physical.   She is interested in having a physical. She has medicare.  WE WILL DO A WELCOME TO MEDICARE VISIT TODAY.   Migraine common, No aura.: triggered by BBQ. If she avoids she doe not have issue.  Diverticulitis, acute: Last episode 2015. Avoids triggers.  Diet: eats a lot of sugar Exercise:  She is fairly acitve, but no dedicated exercise. Retired for 17 years. ETOH: none nonsmoker Has 3 children 2 boy and a girl. Divorced  Blood pressure 118/80, pulse 67, temperature 98.5 F (36.9 C), temperature source Oral, height 5' 5.5" (1.664 m), weight 178 lb 12 oz (81.1 kg).  Review of Systems  Constitutional: Negative for fatigue and fever.  HENT: Negative for congestion.   Eyes: Negative for pain.  Respiratory: Negative for cough and shortness of breath.   Cardiovascular: Negative for chest pain, palpitations and leg swelling.  Gastrointestinal: Negative for abdominal pain.  Genitourinary: Negative for dysuria and vaginal bleeding.  Musculoskeletal: Negative for back pain.       Foot pain bilaterally all her life, tolerable.  Neurological: Positive for headaches. Negative for syncope and light-headedness.  Psychiatric/Behavioral: Negative for dysphoric mood.       Objective:   Physical Exam  Constitutional: Vital signs are normal. She appears well-developed and well-nourished. She is cooperative.  Non-toxic appearance. She does not appear ill. No distress.  HENT:  Head: Normocephalic.  Right Ear: Hearing, tympanic membrane, external ear and ear canal normal.  Left Ear: Hearing, tympanic membrane, external ear and ear canal normal.  Nose: Nose normal.  Eyes: Conjunctivae, EOM and lids are normal. Pupils are equal, round, and reactive to  light. Lids are everted and swept, no foreign bodies found.  Neck: Trachea normal and normal range of motion. Neck supple. Carotid bruit is not present. No thyroid mass and no thyromegaly present.  Cardiovascular: Normal rate, regular rhythm, S1 normal, S2 normal, normal heart sounds and intact distal pulses.  Exam reveals no gallop.   No murmur heard. Pulmonary/Chest: Effort normal and breath sounds normal. No respiratory distress. She has no wheezes. She has no rhonchi. She has no rales.  Abdominal: Soft. Normal appearance and bowel sounds are normal. She exhibits no distension, no fluid wave, no abdominal bruit and no mass. There is no hepatosplenomegaly. There is no tenderness. There is no rebound, no guarding and no CVA tenderness. No hernia.  Lymphadenopathy:    She has no cervical adenopathy.    She has no axillary adenopathy.  Neurological: She is alert. She has normal strength. No cranial nerve deficit or sensory deficit.  Skin: Skin is warm, dry and intact. No rash noted.  Psychiatric: Her speech is normal and behavior is normal. Judgment normal. Her mood appears not anxious. Cognition and memory are normal. She does not exhibit a depressed mood.          Assessment & Plan:  The patient's preventative maintenance and recommended screening tests for an annual wellness exam were reviewed in full today. Brought up to date unless services declined.  Counselled on the importance of diet, exercise, and its role in overall health and mortality. The patient's FH and SH was reviewed, including their home life, tobacco  status, and drug and alcohol status.    EKG: will do Vaccines: Refused Pap/DVE:   Refused. Mammo:  Due Bone Density: due Colon: Plan cologuard. Smoking Status: none ETOH/ drug use: none  Hep C:  due  HIV screen:   refused

## 2016-08-18 NOTE — Assessment & Plan Note (Signed)
Pt will consider stress testing .Marland Kitchen Will let me know once labs performed. Uncleaar risk. ? Chol levels.

## 2016-08-18 NOTE — Patient Instructions (Signed)
Work on regular exercise and weight loss.  Please stop at the lab to set up to have labs drawn. Please stop at the front desk to set up referral.

## 2016-08-18 NOTE — Addendum Note (Signed)
Addended by: Barrington Ellison C on: 08/18/2016 10:22 AM   Modules accepted: Orders

## 2016-08-19 ENCOUNTER — Telehealth: Payer: Self-pay | Admitting: Family Medicine

## 2016-08-19 ENCOUNTER — Encounter: Payer: Self-pay | Admitting: *Deleted

## 2016-08-19 DIAGNOSIS — R9431 Abnormal electrocardiogram [ECG] [EKG]: Secondary | ICD-10-CM

## 2016-08-19 NOTE — Telephone Encounter (Signed)
Referral sent 

## 2016-08-19 NOTE — Telephone Encounter (Signed)
-----   Message from Damita Lack, CMA sent at 08/19/2016 10:04 AM EDT ----- Ms Katie Blake notified as instructed by telephone.  Letter with results and low cholesterol diet hand out mailed to patient.  She is agreeable to doing the treadmill stress test.

## 2016-08-25 ENCOUNTER — Ambulatory Visit (INDEPENDENT_AMBULATORY_CARE_PROVIDER_SITE_OTHER): Payer: 59

## 2016-08-25 DIAGNOSIS — R9431 Abnormal electrocardiogram [ECG] [EKG]: Secondary | ICD-10-CM

## 2016-08-25 LAB — EXERCISE TOLERANCE TEST
CHL CUP MPHR: 155 {beats}/min
CHL CUP RESTING HR STRESS: 71 {beats}/min
CHL RATE OF PERCEIVED EXERTION: 16
CSEPEDS: 0 s
CSEPPHR: 146 {beats}/min
Estimated workload: 7 METS
Exercise duration (min): 6 min
Percent HR: 94 %

## 2016-08-30 ENCOUNTER — Ambulatory Visit
Admission: RE | Admit: 2016-08-30 | Discharge: 2016-08-30 | Disposition: A | Discharge status: Home / Self Care | Payer: Medicare Other | Source: Ambulatory Visit | Attending: Family Medicine | Admitting: Family Medicine

## 2016-08-30 DIAGNOSIS — Z1239 Encounter for other screening for malignant neoplasm of breast: Secondary | ICD-10-CM

## 2016-08-30 DIAGNOSIS — E348 Other specified endocrine disorders: Secondary | ICD-10-CM

## 2016-08-31 LAB — COLOGUARD: COLOGUARD: NEGATIVE

## 2016-09-09 ENCOUNTER — Encounter: Payer: Self-pay | Admitting: Family Medicine

## 2016-11-09 ENCOUNTER — Telehealth: Payer: Self-pay

## 2016-11-09 ENCOUNTER — Encounter: Payer: Self-pay | Admitting: Family Medicine

## 2016-11-09 ENCOUNTER — Ambulatory Visit (INDEPENDENT_AMBULATORY_CARE_PROVIDER_SITE_OTHER): Payer: Medicare Other | Admitting: Family Medicine

## 2016-11-09 VITALS — BP 106/64 | HR 59 | Temp 97.9°F | Wt 177.2 lb

## 2016-11-09 DIAGNOSIS — H10023 Other mucopurulent conjunctivitis, bilateral: Secondary | ICD-10-CM | POA: Diagnosis not present

## 2016-11-09 MED ORDER — MOXIFLOXACIN HCL (2X DAY) 0.5 % OP SOLN: 1.0000 [drp] | Freq: Two times a day (BID) | OPHTHALMIC | 0 refills | Status: DC

## 2016-11-09 MED ORDER — CIPROFLOXACIN HCL 0.3 % OP SOLN: 1.0000 [drp] | OPHTHALMIC | 0 refills | Status: DC

## 2016-11-09 NOTE — Progress Notes (Signed)
Dr. Karleen Hampshire T. Hakeem Frazzini, MD, CAQ [facility] [address] [place], [address] Phone: [phone] Fax: [phone]  11/09/2016  Patient: Katie Blake, MRN: [id number], DOB: 07-May-1951, 66 y.o.  Primary Physician:  Excell Seltzer, MD   Chief Complaint  Patient presents with  . possible Pink eye   Subjective:   Katie Blake is a 66 y.o. very pleasant female patient who presents with the following:  Pink eye: Patient describes waxing and waning history of some eye redness with grouping intermittently, that will sometimes clear up intermittently over the last month or so. She does not have a history of significant allergic conjunctivitis per her report. She has never had allergic conjunctivitis over many years. She does have one exposure that she knows of from pink eye.  Past Medical History, Surgical History, Social History, Family History, Problem List, Medications, and Allergies have been reviewed and updated if relevant.  Patient Active Problem List   Diagnosis Date Noted  . Common migraine without intractability 08/18/2016  . Welcome to Medicare preventive visit 08/18/2016  . Abnormal EKG 08/18/2016  . History of diverticulitis 09/07/2013    No past medical history on file.  Past Surgical History:  Procedure Laterality Date  .  shoulder surgery  1988    rotator cuff, bilateral  . TARSAL TUNNEL RELEASE     left    Social History   Social History  . Marital status: Single    Spouse name: N/A  . Number of children: N/A  . Years of education: N/A   Occupational History  . Not on file.   Social History Main Topics  . Smoking status: Never Smoker  . Smokeless tobacco: Never Used  . Alcohol use No  . Drug use: No  . Sexual activity: Not Currently   Other Topics Concern  . Not on file   Social History Narrative  . No narrative on file    Family History  Problem Relation Age of Onset  . Diabetes Mother   . Cancer  Father 17        brain tumor  . Asthma Brother     Allergies  Allergen Reactions  . Sulfa Antibiotics     Medication list reviewed and updated in full in [facility].   GEN: No acute illnesses, no fevers, chills. GI: No n/v/d, eating normally Pulm: No SOB Interactive and getting along well at home.  Otherwise, ROS is as per the HPI.  Objective:   BP 106/64 (BP Location: Left Arm, Patient Position: Sitting, Cuff Size: Normal)   Pulse (!) 59   Temp 97.9 F (36.6 C) (Oral)   Wt 177 lb 4 oz (80.4 kg)   SpO2 95%   BMI 29.05 kg/m   GEN: WDWN, NAD, Non-toxic, A & O x 3 HEENT: Atraumatic, Normocephalic. Neck supple. No masses, No LAD. PERRLA. EOMI. both eyes are fairly injected. Conjunctiva is injected and pink. Normal vision. Ears and Nose: No external deformity. EXTR: No c/c/e NEURO Normal gait.  PSYCH: Normally interactive. Conversant. Not depressed or anxious appearing.  Calm demeanor.   Laboratory and Imaging Data:  Assessment and Plan:   Pink eye, bilateral  Atypical pink eye. The pattern is not typical for conjunctivitis from an allergic standpoint. I think that it is reasonable to treat this from a bacterial pinkeye standpoint. If she is not better in 10-14 days, recommended follow-up with ophthalmology.  Follow-up: No Follow-up on file.  Future Appointments Date Time Provider Department Center  08/21/2017 9:45 AM Robert Bellowinson, Tarlesia R, LPN [facility]  08/22/2017 10:00 AM Excell SeltzerBedsole, Amy E, MD [facility]    Meds ordered this encounter  Medications  . Moxifloxacin HCl (MOXEZA) 0.5 % SOLN    Sig: Apply 1 drop to eye 2 (two) times daily.    Dispense:  1 Bottle    Refill:  0   Medications Discontinued During This Encounter  Medication Reason  . amoxicillin-clavulanate (AUGMENTIN) 875-125 MG per tablet Completed Course  . ondansetron (ZOFRAN) 4 MG tablet Completed Course   Signed,  Jakai Risse T. Karey Suthers, MD   Allergies as of 11/09/2016        Reactions   Sulfa Antibiotics       Medication List       Accurate as of 11/09/16  9:27 AM. Always use your most recent med list.          Moxifloxacin HCl 0.5 % Soln Commonly known as:  MOXEZA Apply 1 drop to eye 2 (two) times daily.

## 2016-11-09 NOTE — Telephone Encounter (Signed)
Pt was seen earlier today and the moxifloxacin is too expensive. Pt said that ciprofloxin 0.3% 2.5 ml solution for eyes is a substitute that is affordable and pt request this med sent to [facility]. Pt request cb when done.

## 2016-11-09 NOTE — Telephone Encounter (Signed)
done

## 2016-11-09 NOTE — Addendum Note (Signed)
Addended by: Hannah BeatOPLAND, Raksha Wolfgang on: 11/09/2016 09:54 AM   Modules accepted: Orders

## 2017-08-21 ENCOUNTER — Ambulatory Visit: Payer: 59

## 2017-08-22 ENCOUNTER — Encounter: Payer: 59 | Admitting: Family Medicine

## 2017-11-09 ENCOUNTER — Ambulatory Visit (INDEPENDENT_AMBULATORY_CARE_PROVIDER_SITE_OTHER): Payer: Medicare Other | Admitting: Emergency Medicine

## 2017-11-09 DIAGNOSIS — Z23 Encounter for immunization: Secondary | ICD-10-CM | POA: Diagnosis not present

## 2018-04-09 ENCOUNTER — Ambulatory Visit: Payer: Medicare Other | Admitting: Internal Medicine

## 2018-04-11 ENCOUNTER — Encounter

## 2018-04-11 ENCOUNTER — Ambulatory Visit: Payer: Medicare Other | Admitting: Family Medicine

## 2018-04-11 ENCOUNTER — Encounter: Payer: Self-pay | Admitting: Family Medicine

## 2018-04-11 VITALS — BP 116/76 | HR 97 | Temp 98.2°F | Ht 65.5 in | Wt 184.4 lb

## 2018-04-11 DIAGNOSIS — L0201 Cutaneous abscess of face: Secondary | ICD-10-CM

## 2018-04-11 NOTE — Progress Notes (Signed)
   Subjective:    Patient ID: Katie Blake, female    DOB: 26-Feb-1951, 67 y.o.   MRN: [id number]  HPI This is a 67 yo female who presents today with lesion on her face. She noticed it several weeks ago. It got larger and she was concerned that it was a cyst so she applied hydrocortisone cream and a bandaid. The next day it was larger and drained a large amount of pustular fluid. It has been draining small amounts over the last couple of days and has significantly decreased in size. No systemic symptoms.   No past medical history on file. Past Surgical History:  Procedure Laterality Date  .  shoulder surgery  1988    rotator cuff, bilateral  . TARSAL TUNNEL RELEASE     left   Family History  Problem Relation Age of Onset  . Diabetes Mother   . Cancer Father [id number]        brain tumor  . Asthma Brother    Social History   Tobacco Use  . Smoking status: Never Smoker  . Smokeless tobacco: Never Used  Substance Use Topics  . Alcohol use: No  . Drug use: No      Review of Systems Per HPI    Objective:   Physical Exam  Constitutional: She appears well-developed and well-nourished. No distress.  HENT:  Head: Normocephalic and atraumatic.    Eyes: Conjunctivae are normal.  Pulmonary/Chest: Effort normal.  Skin: Skin is warm and dry. She is not diaphoretic.  Psychiatric: She has a normal mood and affect. Her behavior is normal. Judgment and thought content normal.  Vitals reviewed.    BP 116/76 (BP Location: Right Arm, Patient Position: Sitting, Cuff Size: Normal)   Pulse 97   Temp 98.2 F (36.8 C) (Oral)   Ht 5' 5.5" (1.664 m)   Wt 184 lb 6.4 oz (83.6 kg)   SpO2 96%   BMI 30.22 kg/m  Wt Readings from Last 3 Encounters:  04/11/18 184 lb 6.4 oz (83.6 kg)  11/09/16 177 lb 4 oz (80.4 kg)  08/18/16 178 lb 12 oz (81.1 kg)        Assessment & Plan:  1. Cutaneous abscess of face - has drained on its own and is resolving - Provided written and verbal information  regarding diagnosis and treatment. - Encouraged her to keep area clean and dry, avoid applying creams, leave open to air.  -  Patient Instructions  Good to see you today  I think the area on your cheek is resolving  On affected areas, you can use a soap with salicylic acid  If area returns, please follow up    - She is overdue for her routine visit, encouraged her to schedule AWV/CPE with PCP for first of the year  Olean Reeeborah Gessner, FNP-BC  [facility], [facility]  04/11/2018 2:49 PM

## 2018-04-11 NOTE — Patient Instructions (Signed)
Good to see you today  I think the area on your cheek is resolving  On affected areas, you can use a soap with salicylic acid  If area returns, please follow up

## 2018-06-08 ENCOUNTER — Ambulatory Visit: Payer: Self-pay | Admitting: *Deleted

## 2018-06-08 NOTE — Telephone Encounter (Signed)
Contacted by Katie Blake, to triage pt; the pt states that she has a cold with non productive cough, runny nose, and shortness of breath for the past couple of days; the pt says that for the past couple of hours she is out of breath even when she sitting down; recommendations made per nurse triage, she states that she would like to be seen in the office because it is near her home; there is no availability in office today; reiterated to pt that based on her symptoms, she needs to be evaluated and the pt refuses to proceed; she says that she will go to the pharmacy for OTC medications; unable to contact nurse triage at [facility]; will route to office for notification of this encounter.  Reason for Disposition . [1] MODERATE difficulty breathing (e.g., speaks in phrases, SOB even at rest, pulse 100-120) AND [2] NEW-onset or WORSE than normal  Answer Assessment - Initial Assessment Questions 1. RESPIRATORY STATUS: "Describe your breathing?" (e.g., wheezing, shortness of breath, unable to speak, severe coughing)      Short of breath  2. ONSET: "When did this breathing problem begin?"      Started 06/06/2018 but worsened 06/08/2018  3. PATTERN "Does the difficult breathing come and go, or has it been constant since it started?"      constant 4. SEVERITY: "How bad is your breathing?" (e.g., mild, moderate, severe)    - MILD: No SOB at rest, mild SOB with walking, speaks normally in sentences, can lay down, no retractions, pulse < 100.    - MODERATE: SOB at rest, SOB with minimal exertion and prefers to sit, cannot lie down flat, speaks in phrases, mild retractions, audible wheezing, pulse 100-120.    - SEVERE: Very SOB at rest, speaks in single words, struggling to breathe, sitting hunched forward, retractions, pulse > 120      moderate 5. RECURRENT SYMPTOM: "Have you had difficulty breathing before?" If so, ask: "When was the last time?" and "What happened that time?"      When she had  bronchitis and pneumonia 6. CARDIAC HISTORY: "Do you have any history of heart disease?" (e.g., heart attack, angina, bypass surgery, angioplasty)      no 7. LUNG HISTORY: "Do you have any history of lung disease?"  (e.g., pulmonary embolus, asthma, emphysema)     Bronchitis, pneumonia 8. CAUSE: "What do you think is causing the breathing problem?"      Not sure 9. OTHER SYMPTOMS: "Do you have any other symptoms? (e.g., dizziness, runny nose, cough, chest pain, fever)     Runny nose, non-productive cough 10. PREGNANCY: "Is there any chance you are pregnant?" "When was your last menstrual period?"       no 11. TRAVEL: "Have you traveled out of the country in the last month?" (e.g., travel history, exposures)       no  Protocols used: BREATHING DIFFICULTY-A-AH

## 2018-06-08 NOTE — Telephone Encounter (Signed)
I spoke with pt; SOB started earlier today even when pt is sitting;pt has had cold symptoms for 2 days; pt feels warm (cannot take temp), [facility] to take for non prod cough and congestion. Pt refuses to go to UC or ED. Offered pt an appt at Sat clinic and pt said no. Advised pt if condition changes or worsens pt should go to UC or ED. Pt voiced understanding.No available appts at [facility] or [facility]. FYI to Dr Ermalene Searing.

## 2018-06-08 NOTE — Telephone Encounter (Signed)
Noted  

## 2018-06-18 ENCOUNTER — Telehealth: Payer: Self-pay | Admitting: Family Medicine

## 2018-06-18 ENCOUNTER — Ambulatory Visit (INDEPENDENT_AMBULATORY_CARE_PROVIDER_SITE_OTHER): Payer: Medicare Other

## 2018-06-18 VITALS — BP 110/86 | HR 66 | Temp 97.9°F | Ht 66.25 in | Wt 184.8 lb

## 2018-06-18 DIAGNOSIS — Z0000 Encounter for general adult medical examination without abnormal findings: Secondary | ICD-10-CM | POA: Diagnosis not present

## 2018-06-18 DIAGNOSIS — E782 Mixed hyperlipidemia: Secondary | ICD-10-CM

## 2018-06-18 DIAGNOSIS — Z1159 Encounter for screening for other viral diseases: Secondary | ICD-10-CM | POA: Diagnosis not present

## 2018-06-18 LAB — CBC WITH DIFFERENTIAL/PLATELET
BASOS PCT: 1.2 % (ref 0.0–3.0)
Basophils Absolute: 0.1 10*3/uL (ref 0.0–0.1)
Eosinophils Absolute: 0.1 10*3/uL (ref 0.0–0.7)
Eosinophils Relative: 1.6 % (ref 0.0–5.0)
HCT: 44 % (ref 36.0–46.0)
Hemoglobin: 15.3 g/dL — ABNORMAL HIGH (ref 12.0–15.0)
LYMPHS ABS: 2.9 10*3/uL (ref 0.7–4.0)
Lymphocytes Relative: 61.6 % — ABNORMAL HIGH (ref 12.0–46.0)
MCHC: 34.8 g/dL (ref 30.0–36.0)
MCV: 85.6 fl (ref 78.0–100.0)
MONOS PCT: 5.9 % (ref 3.0–12.0)
Monocytes Absolute: 0.3 10*3/uL (ref 0.1–1.0)
NEUTROS ABS: 1.4 10*3/uL (ref 1.4–7.7)
NEUTROS PCT: 29.7 % — AB (ref 43.0–77.0)
PLATELETS: 229 10*3/uL (ref 150.0–400.0)
RBC: 5.14 Mil/uL — ABNORMAL HIGH (ref 3.87–5.11)
RDW: 13.3 % (ref 11.5–15.5)
WBC: 4.7 10*3/uL (ref 4.0–10.5)

## 2018-06-18 LAB — COMPREHENSIVE METABOLIC PANEL
ALK PHOS: 48 U/L (ref 39–117)
ALT: 13 U/L (ref 0–35)
AST: 12 U/L (ref 0–37)
Albumin: 4.1 g/dL (ref 3.5–5.2)
BUN: 13 mg/dL (ref 6–23)
CO2: 27 meq/L (ref 19–32)
Calcium: 9.2 mg/dL (ref 8.4–10.5)
Chloride: 107 mEq/L (ref 96–112)
Creatinine, Ser: 0.89 mg/dL (ref 0.40–1.20)
GFR: 63.23 mL/min (ref >60.00)
GLUCOSE: 95 mg/dL (ref 70–99)
Potassium: 3.9 mEq/L (ref 3.5–5.1)
Sodium: 142 mEq/L (ref 135–145)
Total Bilirubin: 0.5 mg/dL (ref 0.2–1.2)
Total Protein: 7 g/dL (ref 6.0–8.3)

## 2018-06-18 LAB — TSH: TSH: 6.39 u[IU]/mL — ABNORMAL HIGH (ref 0.35–4.50)

## 2018-06-18 LAB — LIPID PANEL
CHOL/HDL RATIO: 5
Cholesterol: 202 mg/dL — ABNORMAL HIGH (ref 0–200)
HDL: 37 mg/dL — AB (ref >39.00)
LDL Cholesterol: 127 mg/dL — ABNORMAL HIGH (ref 0–99)
NONHDL: 164.87
TRIGLYCERIDES: 190 mg/dL — AB (ref 0.0–149.0)
VLDL: 38 mg/dL (ref 0.0–40.0)

## 2018-06-18 NOTE — Progress Notes (Signed)
I reviewed health advisor's note, was available for consultation, and agree with documentation and plan.   Signed,  Shaylea Ucci T. Laterra Lubinski, MD  

## 2018-06-18 NOTE — Patient Instructions (Addendum)
[name] , Thank you for taking time to come for your Medicare Wellness Visit. I appreciate your ongoing commitment to your health goals. Please review the following plan we discussed and let me know if I can assist you in the future.   These are the goals we discussed: Goals    . Patient Stated     Starting 06/18/2018, I will continue to eat in moderation.        This is a list of the screening recommended for you and due dates:  Health Maintenance  Topic Date Due  . Flu Shot  02/14/2019*  . Pneumonia vaccines (1 of 2 - PCV13) 06/18/2048*  . Mammogram  08/31/2018  . Cologuard (Stool DNA test)  09/01/2019  . Tetanus Vaccine  11/10/2027  . DEXA scan (bone density measurement)  Completed  .  Hepatitis C: One time screening is recommended by Center for Disease Control  (CDC) for  adults born from [birth year] through 1965.   Completed  *Topic was postponed. The date shown is not the original due date.   Preventive Care for Adults  A healthy lifestyle and preventive care can promote health and wellness. Preventive health guidelines for adults include the following key practices.  . A routine yearly physical is a good way to check with your health care provider about your health and preventive screening. It is a chance to share any concerns and updates on your health and to receive a thorough exam.  . Visit your dentist for a routine exam and preventive care every 6 months. Brush your teeth twice a day and floss once a day. Good oral hygiene prevents tooth decay and gum disease.  . The frequency of eye exams is based on your age, health, family medical history, use  of contact lenses, and other factors. Follow your health care provider's recommendations for frequency of eye exams.  . Eat a healthy diet. Foods like vegetables, fruits, whole grains, low-fat dairy products, and lean protein foods contain the nutrients you need without too many calories. Decrease your intake of foods high in solid  fats, added sugars, and salt. Eat the right amount of calories for you. Get information about a proper diet from your health care provider, if necessary.  . Regular physical exercise is one of the most important things you can do for your health. Most adults should get at least 150 minutes of moderate-intensity exercise (any activity that increases your heart rate and causes you to sweat) each week. In addition, most adults need muscle-strengthening exercises on 2 or more days a week.  Silver Sneakers may be a benefit available to you. To determine eligibility, you may visit the website: www.silversneakers.com or contact program at [phone])[phone] Mon-Fri between 8AM-8PM.   . Maintain a healthy weight. The body mass index (BMI) is a screening tool to identify possible weight problems. It provides an estimate of body fat based on height and weight. Your health care provider can find your BMI and can help you achieve or maintain a healthy weight.   For adults 20 years and older: ? A BMI below 18.5 is considered underweight. ? A BMI of 18.5 to 24.9 is normal. ? A BMI of 25 to 29.9 is considered overweight. ? A BMI of 30 and above is considered obese.   . Maintain normal blood lipids and cholesterol levels by exercising and minimizing your intake of saturated fat. Eat a balanced diet with plenty of fruit and vegetables. Blood tests for lipids and  cholesterol should begin at age 7 and be repeated every 5 years. If your lipid or cholesterol levels are high, you are over 50, or you are at high risk for heart disease, you may need your cholesterol levels checked more frequently. Ongoing high lipid and cholesterol levels should be treated with medicines if diet and exercise are not working.  . If you smoke, find out from your health care provider how to quit. If you do not use tobacco, please do not start.  . If you choose to drink alcohol, please do not consume more than 2 drinks per day. One drink is  considered to be 12 ounces (355 mL) of beer, 5 ounces (148 mL) of wine, or 1.5 ounces (44 mL) of liquor.  . If you are 69-87 years old, ask your health care provider if you should take aspirin to prevent strokes.  . Use sunscreen. Apply sunscreen liberally and repeatedly throughout the day. You should seek shade when your shadow is shorter than you. Protect yourself by wearing long sleeves, pants, a wide-brimmed hat, and sunglasses year round, whenever you are outdoors.  . Once a month, do a whole body skin exam, using a mirror to look at the skin on your back. Tell your health care provider of new moles, moles that have irregular borders, moles that are larger than a pencil eraser, or moles that have changed in shape or color.

## 2018-06-18 NOTE — Progress Notes (Signed)
PCP notes:   Health maintenance:  Hep C screening - completed PNA vaccine - pt declined Flu vaccine - pt declined  Abnormal screenings:   Mini-Cog score: 19/20 MMSE - Mini Mental State Exam 06/18/2018  Orientation to time 5  Orientation to Place 5  Registration 3  Attention/ Calculation 0  Recall 2  Recall-comments unable to recall 1 of 3 words  Language- name 2 objects 0  Language- repeat 1  Language- follow 3 step command 3  Language- read & follow direction 0  Write a sentence 0  Copy design 0  Total score 19    Patient concerns:   None  Nurse concerns:  None  Next PCP appt:   06/26/18 @ 0915

## 2018-06-18 NOTE — Progress Notes (Signed)
Subjective:   Katie Blake is a 68 y.o. female who presents for an Initial Medicare Annual Wellness Visit.  Review of Systems    N/A  Cardiac Risk Factors include: advanced age (>83men, >7 women);dyslipidemia     Objective:    Today's Vitals   06/18/18 0830  BP: 110/86  Pulse: 66  Temp: 97.9 F (36.6 C)  TempSrc: Oral  SpO2: 94%  Weight: 184 lb 12 oz (83.8 kg)  Height: 5' 6.25" (1.683 m)  PainSc: 0-No pain   Body mass index is 29.59 kg/m.  Advanced Directives 06/18/2018  Does Patient Have a Medical Advance Directive? Yes  Type of Advance Directive Healthcare Power of Attorney  Copy of Healthcare Power of Attorney in Chart? No - copy requested    Current Medications (verified) No outpatient encounter medications on file as of 06/18/2018.   No facility-administered encounter medications on file as of 06/18/2018.     Allergies (verified) Sulfa antibiotics   History: History reviewed. No pertinent past medical history. Past Surgical History:  Procedure Laterality Date  .  shoulder surgery  1988    rotator cuff, bilateral  . TARSAL TUNNEL RELEASE     left   Family History  Problem Relation Age of Onset  . Diabetes Mother   . Cancer Father 10        brain tumor  . Asthma Brother    Social History   Socioeconomic History  . Marital status: Single    Spouse name: Not on file  . Number of children: Not on file  . Years of education: Not on file  . Highest education level: Not on file  Occupational History  . Not on file  Social Needs  . Financial resource strain: Not on file  . Food insecurity:    Worry: Not on file    Inability: Not on file  . Transportation needs:    Medical: Not on file    Non-medical: Not on file  Tobacco Use  . Smoking status: Never Smoker  . Smokeless tobacco: Never Used  Substance and Sexual Activity  . Alcohol use: No  . Drug use: No  . Sexual activity: Not Currently  Lifestyle  . Physical activity:    Days per week:  Not on file    Minutes per session: Not on file  . Stress: Not on file  Relationships  . Social connections:    Talks on phone: Not on file    Gets together: Not on file    Attends religious service: Not on file    Active member of club or organization: Not on file    Attends meetings of clubs or organizations: Not on file    Relationship status: Not on file  Other Topics Concern  . Not on file  Social History Narrative  . Not on file    Tobacco Counseling Counseling given: No   Clinical Intake:  Pre-visit preparation completed: Yes  Pain : No/denies pain Pain Score: 0-No pain     Nutritional Status: BMI 25 -29 Overweight Nutritional Risks: None Diabetes: No  How often do you need to have someone help you when you read instructions, pamphlets, or other written materials from your doctor or pharmacy?: 1 - Never What is the last grade level you completed in school?: 12th grade  Interpreter Needed?: No  Comments: pt lives alone Information entered by :: LPinson, LPN   Activities of Daily Living In your present state of health, do you have  any difficulty performing the following activities: 06/18/2018  Hearing? N  Vision? N  Difficulty concentrating or making decisions? N  Walking or climbing stairs? N  Dressing or bathing? N  Doing errands, shopping? N  Preparing Food and eating ? N  Using the Toilet? N  In the past six months, have you accidently leaked urine? Y  Do you have problems with loss of bowel control? N  Managing your Medications? N  Managing your Finances? N  Housekeeping or managing your Housekeeping? N  Some recent data might be hidden     Immunizations and Health Maintenance Immunization History  Administered Date(s) Administered  . Tdap 11/09/2017   There are no preventive care reminders to display for this patient.  Patient Care Team: Excell SeltzerBedsole, Amy E, MD as PCP - General (Family Medicine)  Indicate any recent Medical Services you may  have received from other than [facility] providers in the past year (date may be approximate).     Assessment:   This is a routine wellness examination for Katie Blake.  Hearing/Vision screen  Hearing Screening   125Hz  250Hz  500Hz  1000Hz  2000Hz  3000Hz  4000Hz  6000Hz  8000Hz   Right ear:   40 40 40  40    Left ear:   40 40 40  40    Vision Screening Comments: Vision exam in July 2019 with Dr. London SheerPeter Dunn  Dietary issues and exercise activities discussed: Current Exercise Habits: The patient does not participate in regular exercise at present, Exercise limited by: None identified  Goals    . Patient Stated     Starting 06/18/2018, I will continue to eat in moderation.       Depression Screen PHQ 2/9 Scores 06/18/2018  PHQ - 2 Score 0  PHQ- 9 Score 0    Fall Risk Fall Risk  06/18/2018  Falls in the past year? 0   Cognitive Function: MMSE - Mini Mental State Exam 06/18/2018  Orientation to time 5  Orientation to Place 5  Registration 3  Attention/ Calculation 0  Recall 2  Recall-comments unable to recall 1 of 3 words  Language- name 2 objects 0  Language- repeat 1  Language- follow 3 step command 3  Language- read & follow direction 0  Write a sentence 0  Copy design 0  Total score 19       PLEASE NOTE: A Mini-Cog screen was completed. Maximum score is 20. A value of 0 denotes this part of Folstein MMSE was not completed or the patient failed this part of the Mini-Cog screening.   Mini-Cog Screening Orientation to Time - Max 5 pts Orientation to Place - Max 5 pts Registration - Max 3 pts Recall - Max 3 pts Language Repeat - Max 1 pts Language Follow 3 Step Command - Max 3 pts   Screening Tests Health Maintenance  Topic Date Due  . INFLUENZA VACCINE  02/14/2019 (Originally 12/14/2017)  . PNA vac Low Risk Adult (1 of 2 - PCV13) 06/18/2048 (Originally 04/08/2016)  . MAMMOGRAM  08/31/2018  . Fecal DNA (Cologuard)  09/01/2019  . TETANUS/TDAP  11/10/2027  . DEXA SCAN  Completed  .  Hepatitis C Screening  Completed       Plan:     I have personally reviewed, addressed, and noted the following in the patient's chart:  A. Medical and social history B. Use of alcohol, tobacco or illicit drugs  C. Current medications and supplements D. Functional ability and status E.  Nutritional status F.  Physical activity G.  Advance directives H. List of other physicians I.  Hospitalizations, surgeries, and ER visits in previous 12 months J.  Vitals K. Screenings to include hearing, vision, cognitive, depression L. Referrals and appointments - none  In addition, I have reviewed and discussed with patient certain preventive protocols, quality metrics, and best practice recommendations. A written personalized care plan for preventive services as well as general preventive health recommendations were provided to patient.  See attached scanned questionnaire for additional information.   Signed,   Randa EvensLesia Pinson, MHA, BS, LPN Health Coach

## 2018-06-18 NOTE — Telephone Encounter (Signed)
-----   Message from Robert Bellow, LPN sent at 3/72/9021  3:19 PM EST ----- Regarding: Labs 2/3 Lab orders needed. Thank you.

## 2018-06-19 LAB — HEPATITIS C ANTIBODY
Hepatitis C Ab: NONREACTIVE
SIGNAL TO CUT-OFF: 0.08 (ref <1.00)

## 2018-06-25 ENCOUNTER — Telehealth: Payer: Self-pay | Admitting: Family Medicine

## 2018-06-25 NOTE — Telephone Encounter (Signed)
Left message asking pt to call office please r/s 2/11 appointment with dr Ermalene Searing

## 2018-06-26 ENCOUNTER — Encounter: Payer: Medicare Other | Admitting: Family Medicine

## 2018-07-03 ENCOUNTER — Encounter: Payer: Self-pay | Admitting: Family Medicine

## 2018-07-03 ENCOUNTER — Ambulatory Visit (INDEPENDENT_AMBULATORY_CARE_PROVIDER_SITE_OTHER): Payer: Medicare Other | Admitting: Family Medicine

## 2018-07-03 VITALS — BP 130/82 | HR 76 | Temp 98.2°F | Ht 66.25 in | Wt 185.5 lb

## 2018-07-03 DIAGNOSIS — Z0000 Encounter for general adult medical examination without abnormal findings: Secondary | ICD-10-CM | POA: Diagnosis not present

## 2018-07-03 DIAGNOSIS — R7989 Other specified abnormal findings of blood chemistry: Secondary | ICD-10-CM | POA: Diagnosis not present

## 2018-07-03 DIAGNOSIS — E7800 Pure hypercholesterolemia, unspecified: Secondary | ICD-10-CM | POA: Diagnosis not present

## 2018-07-03 NOTE — Progress Notes (Signed)
Subjective:    Patient ID: Katie Blake, female    DOB: Jun 10, 1950, 68 y.o.   MRN: [id number]  HPI The patient presents for complete physical and review of chronic health problems. He/She also has the following acute concerns today:  The patient saw Lu Duffel, LPN for medicare wellness. Note reviewed in detail and important notes copied below.  Health maintenance:  Hep C screening - completed PNA vaccine - pt declined Flu vaccine - pt declined  Abnormal screenings:   Mini-Cog score: 19/20 MMSE - Mini Mental State Exam 06/18/2018  Orientation to time 5  Orientation to Place 5  Registration 3  Attention/ Calculation 0  Recall 2  Recall-comments unable to recall 1 of 3 words  Language- name 2 objects 0  Language- repeat 1  Language- follow 3 step command 3  Language- read & follow direction 0  Write a sentence 0  Copy design 0  Total score 19    Patient concerns:   None   07/03/18 TODAY   Reviewed labs in detail.  Elevated Cholesterol: Elevated but improved from last year Still on 8% risk of CVD in next 10 years. Lab Results  Component Value Date   CHOL 202 (H) 06/18/2018   HDL 37.00 (L) 06/18/2018   LDLCALC 127 (H) 06/18/2018   TRIG 190.0 (H) 06/18/2018   CHOLHDL 5 06/18/2018  Using medications without problems: Muscle aches:  Diet compliance: poor , eats as much as you want Exercise: none Other complaints:  TSH slightly elevated.  She is colder.  Mother and sister with thyroid issues.  Social History /Family History/Past Medical History reviewed in detail and updated in EMR if needed. Blood pressure 130/82, pulse 76, temperature 98.2 F (36.8 C), temperature source Oral, height 5' 6.25" (1.683 m), weight 185 lb 8 oz (84.1 kg), SpO2 95 %.   Review of Systems  Constitutional: Negative for fatigue and fever.  HENT: Negative for congestion.   Eyes: Negative for pain.  Respiratory: Negative for cough and shortness of breath.     Cardiovascular: Negative for chest pain, palpitations and leg swelling.  Gastrointestinal: Negative for abdominal pain.  Genitourinary: Negative for dysuria and vaginal bleeding.  Musculoskeletal: Negative for back pain.  Neurological: Positive for headaches. Negative for syncope and light-headedness.       Does not have migraines if avoid triggers  Psychiatric/Behavioral: Negative for dysphoric mood.       Objective:   Physical Exam Constitutional:      General: She is not in acute distress.    Appearance: Normal appearance. She is well-developed. She is not ill-appearing or toxic-appearing.  HENT:     Head: Normocephalic.     Right Ear: Hearing, tympanic membrane, ear canal and external ear normal.     Left Ear: Hearing, tympanic membrane, ear canal and external ear normal.     Nose: Nose normal.  Eyes:     General: Lids are normal. Lids are everted, no foreign bodies appreciated.     Conjunctiva/sclera: Conjunctivae normal.     Pupils: Pupils are equal, round, and reactive to light.  Neck:     Musculoskeletal: Normal range of motion and neck supple.     Thyroid: No thyroid mass or thyromegaly.     Vascular: No carotid bruit.     Trachea: Trachea normal.  Cardiovascular:     Rate and Rhythm: Normal rate and regular rhythm.     Heart sounds: Normal heart sounds, S1 normal and S2 normal. No murmur.  No gallop.   Pulmonary:     Effort: Pulmonary effort is normal. No respiratory distress.     Breath sounds: Normal breath sounds. No wheezing, rhonchi or rales.  Abdominal:     General: Bowel sounds are normal. There is no distension or abdominal bruit.     Palpations: Abdomen is soft. There is no fluid wave or mass.     Tenderness: There is no abdominal tenderness. There is no guarding or rebound.     Hernia: No hernia is present.  Lymphadenopathy:     Cervical: No cervical adenopathy.  Skin:    General: Skin is warm and dry.     Findings: No rash.  Neurological:     Mental  Status: She is alert.     Cranial Nerves: No cranial nerve deficit.     Sensory: No sensory deficit.  Psychiatric:        Mood and Affect: Mood is not anxious or depressed.        Speech: Speech normal.        Behavior: Behavior normal. Behavior is cooperative.        Judgment: Judgment normal.           Assessment & Plan:  The patient's preventative maintenance and recommended screening tests for an annual wellness exam were reviewed in full today. Brought up to date unless services declined.  Counselled on the importance of diet, exercise, and its role in overall health and mortality. The patient's FH and SH was reviewed, including their home life, tobacco status, and drug and alcohol status.   Vaccines:  Tdap uptodate, Refused flu, pna Pap/DVE:   Not indicated Mammo:  08/2016 nml, refused repeat Bone Density: 08/2016 normal , repeat in 5 years Colon: 08/2016 cologuard. Smoking Status: none ETOH/ drug use: none Hep C:  neg HIV screen:  refused

## 2018-07-03 NOTE — Assessment & Plan Note (Addendum)
8 % risk of CVD in 10 years per AHA risk calc.. recommended diet and lifestyle changes. Info given. If not at goal when rechecked.. start statin.

## 2018-07-03 NOTE — Patient Instructions (Addendum)
Please stop at the lab to have labs drawn. Work on low cholesterol diet. Increase exercise to 3-5 times a week.

## 2018-07-04 LAB — T3, FREE: T3, Free: 3.2 pg/mL (ref 2.3–4.2)

## 2018-07-04 LAB — T4, FREE: Free T4: 0.66 ng/dL (ref 0.60–1.60)

## 2018-07-05 ENCOUNTER — Encounter: Payer: Self-pay | Admitting: *Deleted

## 2018-08-13 ENCOUNTER — Other Ambulatory Visit: Payer: Self-pay

## 2018-08-13 ENCOUNTER — Ambulatory Visit (INDEPENDENT_AMBULATORY_CARE_PROVIDER_SITE_OTHER): Payer: Medicare Other | Admitting: Family Medicine

## 2018-08-13 ENCOUNTER — Encounter: Payer: Self-pay | Admitting: Family Medicine

## 2018-08-13 VITALS — Ht 66.25 in

## 2018-08-13 DIAGNOSIS — R1032 Left lower quadrant pain: Secondary | ICD-10-CM | POA: Diagnosis not present

## 2018-08-13 MED ORDER — METRONIDAZOLE 500 MG PO TABS: 500.0000 mg | ORAL_TABLET | Freq: Three times a day (TID) | ORAL | 0 refills | Status: AC

## 2018-08-13 MED ORDER — CIPROFLOXACIN HCL 750 MG PO TABS: 750.0000 mg | ORAL_TABLET | Freq: Two times a day (BID) | ORAL | 0 refills | Status: AC

## 2018-08-13 NOTE — Progress Notes (Signed)
     Katie Blake T. Kamden Stanislaw, MD Primary Care and Sports Medicine [facility] at [facility] [address] [place], [address] Phone: [phone]  FAX: [phone]  ABRIGAIL CORMAN - 68 y.o. female  MRN [id number]  Date of Birth: 1950/06/15  Visit Date: 08/13/2018  PCP: Excell Seltzer, MD  Referred by: Excell Seltzer, MD  Virtual Visit via Telephone Note:  I connected with  [name] on 08/13/2018  2:00 PM EDT by telephone and verified that I am speaking with the correct person using two identifiers.   Location patient: home phone or cell phone Location provider: work or home office Consent: Verbal consent directly obtained from GILBERTO CELENTANO and that there may be a patient responsible charge related to this service. Persons participating in the virtual visit: patient, provider  I discussed the limitations of evaluation and management by telemedicine and the availability of in person appointments.  The patient expressed understanding and agreed to proceed.     History of Present Illness:  Thursday evening, Friday the pain has been pretty bad.  Has not had in a few years.  LLQ pain.  No fever.  Not much eating and drinking OK.  Passing gas, sometimes a little gas.   Review of Systems: pertinent positives and pertinent negatives as per HPI No acute distress verbally  Past Medical History, Surgical History, Social History, Family History, Problem List, Medications, and Allergies have been reviewed and updated if relevant.   Observations/Objective/Exam:  An attempt was made to discern vital signs over the phone and per patient if applicable and possible.   Pulmonary:     Effort: Pulmonary effort is normal. No respiratory distress.  Neurological:     Mental Status: He is alert and oriented to person, place, and time.  Psychiatric:        Thought Content: Thought content normal.        Judgment: Judgment normal.   Left lower quadrant pain  >15 minutes spent  in face to face time with patient, >50% spent in counselling or coordination of care  Discussed the treatment of abdominal pain over the phone is less than ideal.  With h/o diverticulitis and isolated LLQ, reasonable to use ABX now, but with strict face to face evaluation if not turning the corner by Wed.  I discussed the assessment and treatment plan with the patient. The patient was provided an opportunity to ask questions and all were answered. The patient agreed with the plan and demonstrated an understanding of the instructions.   The patient was advised to call back or seek an in-person evaluation if the symptoms worsen or if the condition fails to improve as anticipated.  Follow-up: prn unless noted otherwise below No follow-ups on file.  Meds ordered this encounter  Medications  . ciprofloxacin (CIPRO) 750 MG tablet    Sig: Take 1 tablet (750 mg total) by mouth 2 (two) times daily for 10 days.    Dispense:  20 tablet    Refill:  0  . metroNIDAZOLE (FLAGYL) 500 MG tablet    Sig: Take 1 tablet (500 mg total) by mouth 3 (three) times daily for 10 days.    Dispense:  30 tablet    Refill:  0   No orders of the defined types were placed in this encounter.   Signed,  Elpidio Galea. Joshva Labreck, MD

## 2018-11-22 ENCOUNTER — Ambulatory Visit (INDEPENDENT_AMBULATORY_CARE_PROVIDER_SITE_OTHER): Payer: Medicare Other | Admitting: Internal Medicine

## 2018-11-22 ENCOUNTER — Encounter: Payer: Self-pay | Admitting: Internal Medicine

## 2018-11-22 ENCOUNTER — Other Ambulatory Visit: Payer: Self-pay

## 2018-11-22 VITALS — BP 108/60 | HR 61 | Temp 98.6°F | Ht 66.25 in | Wt 179.1 lb

## 2018-11-22 DIAGNOSIS — R600 Localized edema: Secondary | ICD-10-CM

## 2018-11-22 MED ORDER — METHYLPREDNISOLONE ACETATE 80 MG/ML IJ SUSP
80.0000 mg | Freq: Once | INTRAMUSCULAR | Status: AC
  Administered 2018-11-22: 10:00:00 80 mg via INTRAMUSCULAR

## 2018-11-22 NOTE — Patient Instructions (Signed)
Angioedema  Angioedema is sudden swelling in the body. The swelling can happen in any part of the body. It often happens on the skin and causes itchy, bumpy patches (hives) to form. This condition may:  Happen only one time.  Happen more than one time. It may come back at random times.  Keep coming back for a number of years. Someday it may stop coming back. Follow these instructions at home:  Take over-the-counter and prescription medicines only as told by your doctor.  If you were given medicines for emergency allergy treatment, always carry them with you.  Wear a medical bracelet as told by your doctor.  Avoid the things that cause your attacks (triggers).  If this condition was passed to you from your parents and you want to have kids, talk to your doctor. Your kids may also have this condition. Contact a doctor if:  You have another attack.  Your attacks happen more often, even after you take steps to prevent them.  This condition was passed to you by your parents and you want to have kids. Get help right away if:  Your mouth, tongue, or lips get very swollen.  You have trouble breathing.  You have trouble swallowing.  You pass out (faint). This information is not intended to replace advice given to you by your health care provider. Make sure you discuss any questions you have with your health care provider. Document Released: 04/20/2009 Document Revised: 04/14/2017 Document Reviewed: 11/10/2015 Elsevier Patient Education  2020 [employer]  

## 2018-11-22 NOTE — Progress Notes (Signed)
Subjective:    Patient ID: Katie Blake, female    DOB: Mar 07, 1951, 68 y.o.   MRN: [id number]  HPI  Pt presents to the clinic today with c/o swelling under the left eye. This started 2-3 days ago. She reports mild pain in the outer conner of her left eye, but denies eye pain, redness, drainage or visual changes. She does wear glasses but not contacts. She denies any trauma to the eye. She denies runny nose, nasal congestion, ear pain or sore throat. She does not take any medications consistently. She has tried cool compresses with minimal relief.  Review of Systems  History reviewed. No pertinent past medical history.  No current outpatient medications on file.   No current facility-administered medications for this visit.     Allergies  Allergen Reactions  . Sulfa Antibiotics     Family History  Problem Relation Age of Onset  . Diabetes Mother   . Cancer Father 50        brain tumor  . Asthma Brother     Social History   Socioeconomic History  . Marital status: Single    Spouse name: Not on file  . Number of children: Not on file  . Years of education: Not on file  . Highest education level: Not on file  Occupational History  . Not on file  Social Needs  . Financial resource strain: Not on file  . Food insecurity    Worry: Not on file    Inability: Not on file  . Transportation needs    Medical: Not on file    Non-medical: Not on file  Tobacco Use  . Smoking status: Never Smoker  . Smokeless tobacco: Never Used  Substance and Sexual Activity  . Alcohol use: No  . Drug use: No  . Sexual activity: Not Currently  Lifestyle  . Physical activity    Days per week: Not on file    Minutes per session: Not on file  . Stress: Not on file  Relationships  . Social Herbalist on phone: Not on file    Gets together: Not on file    Attends religious service: Not on file    Active member of club or organization: Not on file    Attends meetings of clubs  or organizations: Not on file    Relationship status: Not on file  . Intimate partner violence    Fear of current or ex partner: Not on file    Emotionally abused: Not on file    Physically abused: Not on file    Forced sexual activity: Not on file  Other Topics Concern  . Not on file  Social History Narrative  . Not on file     Constitutional: Denies fever, malaise, fatigue, headache or abrupt weight changes.  HEENT: Pt reports swelling under left eye. Denies eye pain, eye redness, ear pain, ringing in the ears, wax buildup, runny nose, nasal congestion, bloody nose, or sore throat. Respiratory: Denies difficulty breathing, shortness of breath, cough or sputum production.   Cardiovascular: Denies chest pain, chest tightness, palpitations or swelling in the hands or feet.  Skin: Denies redness, rashes, lesions or ulcercations.   No other specific complaints in a complete review of systems (except as listed in HPI above).     Objective:   Physical Exam  BP 108/60 (BP Location: Left Arm, Patient Position: Sitting, Cuff Size: Normal)   Pulse 61   Temp 98.6 F (  37 C) (Temporal)   Ht 5' 6.25" (1.683 m)   Wt 179 lb 1.9 oz (81.2 kg)   SpO2 96%   BMI 28.69 kg/m  Wt Readings from Last 3 Encounters:  11/22/18 179 lb 1.9 oz (81.2 kg)  07/03/18 185 lb 8 oz (84.1 kg)  06/18/18 184 lb 12 oz (83.8 kg)    General: Appears her stated age, well developed, well nourished in NAD. Skin: Warm, dry and intact. No rashes noted. HEENT: Head: normal shape and size; Eyes: sclera white, no icterus, conjunctiva pink, PERRLA and EOMs intact; slight periorbital edema under the left eye. Neck:  No adenopathy noted. Neurological: Alert and oriented.  Psychiatric: Mood and affect normal. Behavior is normal. Judgment and thought content normal.    BMET    Component Value Date/Time   NA 142 06/18/2018 0824   K 3.9 06/18/2018 0824   CL 107 06/18/2018 0824   CO2 27 06/18/2018 0824   GLUCOSE 95  06/18/2018 0824   BUN 13 06/18/2018 0824   CREATININE 0.89 06/18/2018 0824   CREATININE 0.77 09/17/2013 1723   CALCIUM 9.2 06/18/2018 0824    Lipid Panel     Component Value Date/Time   CHOL 202 (H) 06/18/2018 0824   TRIG 190.0 (H) 06/18/2018 0824   HDL 37.00 (L) 06/18/2018 0824   CHOLHDL 5 06/18/2018 0824   VLDL 38.0 06/18/2018 0824   LDLCALC 127 (H) 06/18/2018 0824    CBC    Component Value Date/Time   WBC 4.7 06/18/2018 0824   RBC 5.14 (H) 06/18/2018 0824   HGB 15.3 (H) 06/18/2018 0824   HCT 44.0 06/18/2018 0824   PLT 229.0 06/18/2018 0824   MCV 85.6 06/18/2018 0824   MCV 86.9 10/06/2013 1838   MCH 28.4 10/06/2013 1838   MCHC 34.8 06/18/2018 0824   RDW 13.3 06/18/2018 0824   LYMPHSABS 2.9 06/18/2018 0824   MONOABS 0.3 06/18/2018 0824   EOSABS 0.1 06/18/2018 0824   BASOSABS 0.1 06/18/2018 0824    Hgb A1C No results found for: HGBA1C         Assessment & Plan:   Periorbital Edema, Left Eye:  Appears to be dependent edema 80 mg Depo IM today Take Benadryl 25 mg every 8 hours as needed Continue cool compresses prn  Return precautions discussed Nicki Reaperegina Baity, NP

## 2018-11-26 ENCOUNTER — Ambulatory Visit (INDEPENDENT_AMBULATORY_CARE_PROVIDER_SITE_OTHER): Payer: Medicare Other | Admitting: Internal Medicine

## 2018-11-26 ENCOUNTER — Encounter: Payer: Self-pay | Admitting: Internal Medicine

## 2018-11-26 ENCOUNTER — Other Ambulatory Visit: Payer: Self-pay

## 2018-11-26 VITALS — BP 110/78 | HR 59 | Temp 98.7°F | Ht 66.25 in | Wt 179.0 lb

## 2018-11-26 DIAGNOSIS — H00025 Hordeolum internum left lower eyelid: Secondary | ICD-10-CM | POA: Diagnosis not present

## 2018-11-26 NOTE — Progress Notes (Signed)
Subjective:    Patient ID: Katie Blake, female    DOB: June 03, 1950, 68 y.o.   MRN: [id number]  HPI  Patient presents to the clinic today with c/o left eye swelling that has not resolved since last visit on 11/22/2018.  She complains of pain in the outer corner of her eye that is tender to touch.  At the last visit, she had a steroid injection and was advised to take Benadryl as needed and return if symptoms worsen or fail to improve.   Review of Systems      No past medical history on file.  No current outpatient medications on file.   No current facility-administered medications for this visit.     Allergies  Allergen Reactions  . Sulfa Antibiotics     Family History  Problem Relation Age of Onset  . Diabetes Mother   . Cancer Father [id number]        brain tumor  . Asthma Brother     Social History   Socioeconomic History  . Marital status: Single    Spouse name: Not on file  . Number of children: Not on file  . Years of education: Not on file  . Highest education level: Not on file  Occupational History  . Not on file  Social Needs  . Financial resource strain: Not on file  . Food insecurity    Worry: Not on file    Inability: Not on file  . Transportation needs    Medical: Not on file    Non-medical: Not on file  Tobacco Use  . Smoking status: Never Smoker  . Smokeless tobacco: Never Used  Substance and Sexual Activity  . Alcohol use: No  . Drug use: No  . Sexual activity: Not Currently  Lifestyle  . Physical activity    Days per week: Not on file    Minutes per session: Not on file  . Stress: Not on file  Relationships  . Social Musicianconnections    Talks on phone: Not on file    Gets together: Not on file    Attends religious service: Not on file    Active member of club or organization: Not on file    Attends meetings of clubs or organizations: Not on file    Relationship status: Not on file  . Intimate partner violence    Fear of current or ex  partner: Not on file    Emotionally abused: Not on file    Physically abused: Not on file    Forced sexual activity: Not on file  Other Topics Concern  . Not on file  Social History Narrative  . Not on file     Constitutional: Denies fever, malaise, fatigue, headache or abrupt weight changes.  HEENT: Complains of left eye pain in outer corner with associated swelling. Denies  eye redness, ear pain, ringing in the ears, wax buildup, runny nose, nasal congestion, bloody nose, or sore throat. Respiratory: Denies difficulty breathing, shortness of breath, cough or sputum production.     Objective:   Physical Exam   Wt Readings from Last 3 Encounters:  11/22/18 179 lb 1.9 oz (81.2 kg)  07/03/18 185 lb 8 oz (84.1 kg)  06/18/18 184 lb 12 oz (83.8 kg)    General: Appears her stated age, well developed, well nourished in NAD. HEENT: Head: normal shape and size; Eyes: sclera white, no icterus, conjunctiva pink with pea-sized hordeolum in outer aspect of left lower eyelid, PERRLA and  EOMs intact;   BMET    Component Value Date/Time   NA 142 06/18/2018 0824   K 3.9 06/18/2018 0824   CL 107 06/18/2018 0824   CO2 27 06/18/2018 0824   GLUCOSE 95 06/18/2018 0824   BUN 13 06/18/2018 0824   CREATININE 0.89 06/18/2018 0824   CREATININE 0.77 09/17/2013 1723   CALCIUM 9.2 06/18/2018 0824    Lipid Panel     Component Value Date/Time   CHOL 202 (H) 06/18/2018 0824   TRIG 190.0 (H) 06/18/2018 0824   HDL 37.00 (L) 06/18/2018 0824   CHOLHDL 5 06/18/2018 0824   VLDL 38.0 06/18/2018 0824   LDLCALC 127 (H) 06/18/2018 0824    CBC    Component Value Date/Time   WBC 4.7 06/18/2018 0824   RBC 5.14 (H) 06/18/2018 0824   HGB 15.3 (H) 06/18/2018 0824   HCT 44.0 06/18/2018 0824   PLT 229.0 06/18/2018 0824   MCV 85.6 06/18/2018 0824   MCV 86.9 10/06/2013 1838   MCH 28.4 10/06/2013 1838   MCHC 34.8 06/18/2018 0824   RDW 13.3 06/18/2018 0824   LYMPHSABS 2.9 06/18/2018 0824   MONOABS 0.3  06/18/2018 0824   EOSABS 0.1 06/18/2018 0824   BASOSABS 0.1 06/18/2018 0824    Hgb A1C No results found for: HGBA1C         Assessment & Plan:   Stye, Internal, Left Lower Lid:  Warm compresses TID to encourage it to drain Ibuprofen as needed for pain and inflammation Encouraged her to gently squeeze with 2 Qtips to see if she can get this to drain If worsen, follow up with ophthalmologist for further eval and treatment  Return precautions discussed Webb Silversmith, NP

## 2018-11-26 NOTE — Patient Instructions (Signed)

## 2019-05-02 ENCOUNTER — Ambulatory Visit (INDEPENDENT_AMBULATORY_CARE_PROVIDER_SITE_OTHER): Payer: Medicare Other | Admitting: Family Medicine

## 2019-05-02 ENCOUNTER — Ambulatory Visit (INDEPENDENT_AMBULATORY_CARE_PROVIDER_SITE_OTHER)
Admission: RE | Admit: 2019-05-02 | Discharge: 2019-05-02 | Disposition: A | Discharge status: Home / Self Care | Payer: Medicare Other | Source: Ambulatory Visit | Attending: Family Medicine | Admitting: Family Medicine

## 2019-05-02 ENCOUNTER — Other Ambulatory Visit: Payer: Self-pay

## 2019-05-02 ENCOUNTER — Encounter: Payer: Self-pay | Admitting: Family Medicine

## 2019-05-02 VITALS — BP 128/88 | HR 76 | Temp 97.7°F | Resp 18 | Ht 66.25 in | Wt 182.5 lb

## 2019-05-02 DIAGNOSIS — M7501 Adhesive capsulitis of right shoulder: Secondary | ICD-10-CM

## 2019-05-02 DIAGNOSIS — M19011 Primary osteoarthritis, right shoulder: Secondary | ICD-10-CM | POA: Diagnosis not present

## 2019-05-02 DIAGNOSIS — M25512 Pain in left shoulder: Secondary | ICD-10-CM

## 2019-05-02 DIAGNOSIS — M7502 Adhesive capsulitis of left shoulder: Secondary | ICD-10-CM | POA: Diagnosis not present

## 2019-05-02 DIAGNOSIS — M25511 Pain in right shoulder: Secondary | ICD-10-CM

## 2019-05-02 DIAGNOSIS — M19012 Primary osteoarthritis, left shoulder: Secondary | ICD-10-CM | POA: Diagnosis not present

## 2019-05-02 MED ORDER — METHYLPREDNISOLONE ACETATE 40 MG/ML IJ SUSP
80.0000 mg | Freq: Once | INTRAMUSCULAR | Status: AC
  Administered 2019-05-02: 10:00:00 80 mg via INTRA_ARTICULAR

## 2019-05-02 NOTE — Progress Notes (Signed)
Katie Layfield T. Artis Beggs, MD Primary Care and [place] at [facility] [place], [address] Phone: [phone]  FAX: [place] - 68 y.o. female  MRN [id number]  Date of Birth: Apr 07, 1951  Visit Date: 05/02/2019  PCP: Katie Sanders, MD  Referred by: Katie Sanders, MD  Chief Complaint  Patient presents with  . Shoulder Pain    bilateral. x few weeks    This visit occurred during the SARS-CoV-2 public health emergency.  Safety protocols were in place, including screening questions prior to the visit, additional usage of staff PPE, and extensive cleaning of exam room while observing appropriate contact time as indicated for disinfecting solutions.   Subjective:   Katie Blake is a 68 y.o. very pleasant female patient who presents with the following: shoulder pain  The patient noted above presents with shoulder pain that has been ongoing for at least 3 weeks, possibly longer and L >R there is no history of trauma or accident. The patient denies neck pain or radicular symptoms. No shoulder blade pain Denies dislocation, subluxation, separation of the shoulder. The patient does complain of pain with flexion, abduction, and terminal motion.  Significant restriction of motion. she describes a deep ache around the shoulder, and sometimes it will wake the patient up at night.  S/p RTC surgery in 1988.   DCE - ? RTC repair. Dr. Collier Blake.   Raising arm, hurting all over the place. No injury.  2 or 3 weeks, ? Longer.  L > R.  Poor ROM.  No rad sx.   b shoulder injections   Medications Tried: OTC only Ice or Heat: minimal help Tried PT: No  Prior shoulder Injury: y Prior surgery: Open shoulder surgery, DCE, ? rtc repair Prior fracture: No  Past Medical History, Surgical History, Social History, Family History, Medications, and allergies reviewed and updated if relevant.   Patient Active Problem List   Diagnosis Date Noted  . High cholesterol 07/03/2018  . Common migraine without intractability 08/18/2016  . History of diverticulitis 09/07/2013    No past medical history on file.  Past Surgical History:  Procedure Laterality Date  .  shoulder surgery  1988    rotator cuff, bilateral  . TARSAL TUNNEL RELEASE     left    Social History   Socioeconomic History  . Marital status: Single    Spouse name: Not on file  . Number of children: Not on file  . Years of education: Not on file  . Highest education level: Not on file  Occupational History  . Not on file  Tobacco Use  . Smoking status: Never Smoker  . Smokeless tobacco: Never Used  Substance and Sexual Activity  . Alcohol use: No  . Drug use: No  . Sexual activity: Not Currently  Other Topics Concern  . Not on file  Social History Narrative  . Not on file   Social Determinants of Health   Financial Resource Strain:   . Difficulty of Paying Living Expenses: Not on file  Food Insecurity:   . Worried About Charity fundraiser in the Last Year: Not on file  . Ran Out of Food in the Last Year: Not on file  Transportation Needs:   . Lack of Transportation (Medical): Not on file  . Lack of Transportation (Non-Medical): Not on file  Physical Activity:   . Days of Exercise per Week: Not on file  .  Minutes of Exercise per Session: Not on file  Stress:   . Feeling of Stress : Not on file  Social Connections:   . Frequency of Communication with Friends and Family: Not on file  . Frequency of Social Gatherings with Friends and Family: Not on file  . Attends Religious Services: Not on file  . Active Member of Clubs or Organizations: Not on file  . Attends Banker Meetings: Not on file  . Marital Status: Not on file  Intimate Partner Violence:   . Fear of Current or Ex-Partner: Not on file  . Emotionally Abused: Not on file  . Physically Abused: Not on file  . Sexually Abused: Not on file    Family  History  Problem Relation Age of Onset  . Diabetes Mother   . Cancer Father 33        brain tumor  . Asthma Brother     Allergies  Allergen Reactions  . Sulfa Antibiotics     Medication list reviewed and updated in full in [facility].  GEN: No fevers, chills. Nontoxic. Primarily MSK c/o today. MSK: Detailed in the HPI GI: tolerating PO intake without difficulty Neuro: No numbness, parasthesias, or tingling associated. Otherwise the pertinent positives of the ROS are noted above.    Objective:   Blood pressure 128/88, pulse 76, temperature 97.7 F (36.5 C), resp. rate 18, height 5' 6.25" (1.683 m), weight 182 lb 8 oz (82.8 kg).   GEN: WDWN, NAD, Non-toxic, Alert & Oriented x 3 HEENT: Atraumatic, Normocephalic.  Ears and Nose: No external deformity. EXTR: No clubbing/cyanosis/edema NEURO: Normal gait.  PSYCH: Normally interactive. Conversant. Not depressed or anxious appearing.  Calm demeanor.   Shoulder: R and L Inspection: No muscle wasting or winging Ecchymosis/edema: neg  AC joint, scapula, clavicle: NT Cervical spine: NT, full ROM Spurling's: neg ABNORMAL SIDE TESTED: b UNLESS OTHERWISE NOTED, THE CONTRALATERAL SIDE HAS FULL RANGE OF MOTION. Abduction: 5/5, LIMITED TO 90 on L, 140 on the R DEGREES Flexion: 5/5, LIMITED TO 115 on L and 150 on R DEGNO ROM  IR, lift-off: 5/5. TESTED AT 90 DEGREES OF ABDUCTION, LIMITED TO 0 on the L and 10 on the R DEGREES ER at neutral:  5/5, TESTED AT 90 DEGREES OF ABDUCTION, LIMITED TO 10 deg on the L and 20 on the R DEGREES AC crossover and compression: PAIN Drop Test: neg Empty Can: neg Supraspinatus insertion: NT Bicipital groove: NT ALL OTHER SPECIAL TESTING EQUIVOCAL GIVEN LOSS OF MOTION C5-T1 intact Sensation intact Grip 5/5   XR Shoulder Right  Result Date: 05/02/2019 CLINICAL DATA:  Osteoarthritis versus frozen shoulder EXAM: RIGHT SHOULDER - 2+ VIEW COMPARISON:  None FINDINGS: Osseous demineralization. AC  joint alignment normal. Glenohumeral joint space appears preserved. Visualized RIGHT ribs intact. No fracture, dislocation, or bone destruction. IMPRESSION: Osseous demineralization. No additional abnormalities. Electronically Signed   By: Ulyses Southward M.D.   On: 05/02/2019 09:08   XR Shoulder Left  Result Date: 05/02/2019 CLINICAL DATA:  Osteoarthritis versus frozen shoulder EXAM: LEFT SHOULDER - 2+ VIEW COMPARISON:  None FINDINGS: At osseous mineralization decreased. AC joint alignment normal. Glenohumeral joint space appears preserved. Visualized LEFT ribs intact. No fracture, dislocation, or bone destruction. IMPRESSION: Mild osseous demineralization without additional abnormalities. Electronically Signed   By: Ulyses Southward M.D.   On: 05/02/2019 09:07     Assessment and Plan:     ICD-10-CM   1. Adhesive capsulitis of both shoulders  M75.01 methylPREDNISolone acetate (DEPO-MEDROL)  injection 80 mg   M75.02 methylPREDNISolone acetate (DEPO-MEDROL) injection 80 mg  2. Bilateral shoulder pain, unspecified chronicity  M25.511 XR Shoulder Left   M25.512 XR Shoulder Right   >25 minutes spent in face to face time with patient, >50% spent in counselling or coordination of care  Patient was given a systematic ROM protocol from [facility] to be done daily. Emphasized importance of adherence, help of PT, daily HEP.  The average length of total symptoms is 12-18 months going through 3 different phases in the freezing and thawing process. Reviewed all with patient.   Tylenol or NSAID of choice prn for pain relief Intraarticular shoulder injections discussed with patient, which have good evidence for accelerating the thawing phase.  Patient will be sent for formal PT for aggressive frozen shoulder ROM eventually Will need RTC str and scapular stabilization to fix underlying mechanics.  Intraarticular Shoulder Aspiration/Injection Procedure Note Katie Blake Sep 10, 1950 Date of procedure:  05/02/2019  Procedure: Large Joint Aspiration / Injection of Shoulder, Intraarticular, L Indications: Pain  Procedure Details Verbal consent was obtained from the patient. Risks including infection explained and contrasted with benefits and alternatives. Patient prepped with Chloraprep and Ethyl Chloride used for anesthesia. An intraarticular shoulder injection was performed using the posterior approach; needle placed into joint capsule without difficulty. The patient tolerated the procedure well and had decreased pain post injection. No complications. Injection: 8 cc of Lidocaine 1% and 2 mL Depo-Medrol 40 mg. Needle: 21 gauge, 2 inch   Intraarticular Shoulder Aspiration/Injection Procedure Note Katie Blake Sep 10, 1950 Date of procedure: 05/02/2019  Procedure: Large Joint Aspiration / Injection of Shoulder, Intraarticular, B Indications: Pain  Procedure Details Verbal consent was obtained from the patient. Risks including infection explained and contrasted with benefits and alternatives. Patient prepped with Chloraprep and Ethyl Chloride used for anesthesia. An intraarticular shoulder injection was performed using the posterior approach; needle placed into joint capsule without difficulty. The patient tolerated the procedure well and had decreased pain post injection. No complications. Injection: 8 cc of Lidocaine 1% and 2 mL Depo-Medrol 40 mg. Needle: 21 gauge, 2 inch   I appreciate the opportunity to evaluate this very friendly patient. If you have any question regarding her care or prognosis, do not hesitate to ask.  Follow-up: Return in about 2 months (around 07/03/2019).  Meds ordered this encounter  Medications  . methylPREDNISolone acetate (DEPO-MEDROL) injection 80 mg  . methylPREDNISolone acetate (DEPO-MEDROL) injection 80 mg   Orders Placed This Encounter  Procedures  . XR Shoulder Left  . XR Shoulder Right    Signed,  Karleen HampshireSpencer T. Mariadejesus Cade, MD   Patient's Medications    No medications on file

## 2019-07-03 ENCOUNTER — Ambulatory Visit: Payer: Medicare Other | Admitting: Family Medicine

## 2019-08-26 ENCOUNTER — Telehealth: Payer: Self-pay | Admitting: Family Medicine

## 2019-08-26 ENCOUNTER — Encounter: Payer: Self-pay | Admitting: Family Medicine

## 2019-08-26 ENCOUNTER — Ambulatory Visit (INDEPENDENT_AMBULATORY_CARE_PROVIDER_SITE_OTHER): Payer: Medicare Other | Admitting: Family Medicine

## 2019-08-26 ENCOUNTER — Other Ambulatory Visit: Payer: Self-pay

## 2019-08-26 VITALS — BP 110/80 | HR 78 | Temp 98.0°F | Ht 66.25 in | Wt 177.2 lb

## 2019-08-26 DIAGNOSIS — K5792 Diverticulitis of intestine, part unspecified, without perforation or abscess without bleeding: Secondary | ICD-10-CM

## 2019-08-26 DIAGNOSIS — R1032 Left lower quadrant pain: Secondary | ICD-10-CM

## 2019-08-26 MED ORDER — CIPROFLOXACIN HCL 750 MG PO TABS: 750.0000 mg | ORAL_TABLET | Freq: Two times a day (BID) | ORAL | 0 refills | Status: DC

## 2019-08-26 MED ORDER — METRONIDAZOLE 500 MG PO TABS: 500.0000 mg | ORAL_TABLET | Freq: Three times a day (TID) | ORAL | 0 refills | Status: DC

## 2019-08-26 NOTE — Telephone Encounter (Signed)
Patient stopped by desk to schedule her awv for this year.  Patient said she didn't want to see Dr.Bedsole.  Patient would like to switch to [facility]. Patient wouldn't give me a reason.  Can patient switch to [place]?  Patient would like to schedule her awv.

## 2019-08-26 NOTE — Telephone Encounter (Signed)
Fine with me if okay with Dr. Ermalene Searing

## 2019-08-26 NOTE — Progress Notes (Signed)
Ryn Peine T. Kalilah Barua, MD, CAQ Sports Medicine  Primary Care and Sports Medicine [facility] at [facility] [address] [place], [address]  Phone: [phone]  FAX: [phone]  DEYANI HEGARTY - 69 y.o. female  MRN [id number]  Date of Birth: 1950-12-15  Date: 08/26/2019  PCP: Excell Seltzer, MD  Referral: Excell Seltzer, MD  Chief Complaint  Patient presents with  . Abdominal Pain    ?diverticulitis    This visit occurred during the SARS-CoV-2 public health emergency.  Safety protocols were in place, including screening questions prior to the visit, additional usage of staff PPE, and extensive cleaning of exam room while observing appropriate contact time as indicated for disinfecting solutions.   Subjective:   Katie Blake is a 69 y.o. very pleasant female patient with Body mass index is 28.39 kg/m. who presents with the following:  She is a pleasant 69 year old female has a known history of diverticulitis I actually saw her 1 year ago for the same problem.  She was treated with Cipro and Flagyl and recovered quickly.  She has some diverticulitis about once a year.  Today she has pain in the left lower quadrant and to a lesser extent in the hypogastric and right lower quadrant.  She has been eating not nearly as much, drinking okay, she is passing gas.  She is not in any form of fever.  Abdominal pain and gas.  Has been going on last week.  Has been sick for a couple of weeks.  Stomach has been hurting a lot.  No fever.  No eating much.    Fluctuation.    Review of Systems is noted in the HPI, as appropriate  Objective:   BP 110/80   Pulse 78   Temp 98 F (36.7 C) (Temporal)   Ht 5' 6.25" (1.683 m)   Wt 177 lb 4 oz (80.4 kg)   SpO2 97%   BMI 28.39 kg/m   GEN: No acute distress; alert,appropriate. PULM: Breathing comfortably in no respiratory distress PSYCH: Normally interactive.   ABD: S, left lower quadrant tenderness and to a lesser  extent in the hypogastric region as well as the right lower quadrant, ND, + BS, No rebound, No HSM   Laboratory and Imaging Data:  Assessment and Plan:     ICD-10-CM   1. Acute diverticulitis  K57.92   2. Left lower quadrant pain  R10.32    Classic left lower quadrant pain with diverticulitis.  Treat as such.  Reviewed precautions begin diet, fluid intake.  Follow-up: No follow-ups on file.  Meds ordered this encounter  Medications  . ciprofloxacin (CIPRO) 750 MG tablet    Sig: Take 1 tablet (750 mg total) by mouth 2 (two) times daily for 10 days.    Dispense:  20 tablet    Refill:  0  . metroNIDAZOLE (FLAGYL) 500 MG tablet    Sig: Take 1 tablet (500 mg total) by mouth 3 (three) times daily for 10 days.    Dispense:  30 tablet    Refill:  0   There are no discontinued medications. No orders of the defined types were placed in this encounter.   Signed,  Elpidio Galea. Kathrine Rieves, MD   Outpatient Encounter Medications as of 08/26/2019  Medication Sig  . ciprofloxacin (CIPRO) 750 MG tablet Take 1 tablet (750 mg total) by mouth 2 (two) times daily for 10 days.  . metroNIDAZOLE (FLAGYL) 500 MG tablet Take 1 tablet (  500 mg total) by mouth 3 (three) times daily for 10 days.   No facility-administered encounter medications on file as of 08/26/2019.

## 2019-08-27 NOTE — Telephone Encounter (Signed)
I have no concerns regarding this patient.Marland Kitchen okay to change providers.

## 2019-08-28 ENCOUNTER — Emergency Department (HOSPITAL_COMMUNITY): Payer: Medicare Other

## 2019-08-28 ENCOUNTER — Encounter (HOSPITAL_COMMUNITY): Payer: Self-pay

## 2019-08-28 ENCOUNTER — Other Ambulatory Visit: Payer: Self-pay

## 2019-08-28 ENCOUNTER — Telehealth: Payer: Self-pay | Admitting: Family Medicine

## 2019-08-28 ENCOUNTER — Inpatient Hospital Stay (HOSPITAL_COMMUNITY)
Admission: EM | Admit: 2019-08-28 | Discharge: 2019-09-01 | DRG: 391 | Disposition: A | Discharge status: Home / Self Care | Payer: Medicare Other | Attending: Internal Medicine | Admitting: Internal Medicine

## 2019-08-28 DIAGNOSIS — Z882 Allergy status to sulfonamides status: Secondary | ICD-10-CM | POA: Diagnosis not present

## 2019-08-28 DIAGNOSIS — K5732 Diverticulitis of large intestine without perforation or abscess without bleeding: Secondary | ICD-10-CM | POA: Diagnosis not present

## 2019-08-28 DIAGNOSIS — U071 COVID-19: Secondary | ICD-10-CM | POA: Diagnosis not present

## 2019-08-28 DIAGNOSIS — Z8719 Personal history of other diseases of the digestive system: Secondary | ICD-10-CM

## 2019-08-28 DIAGNOSIS — E876 Hypokalemia: Secondary | ICD-10-CM | POA: Diagnosis not present

## 2019-08-28 DIAGNOSIS — K5792 Diverticulitis of intestine, part unspecified, without perforation or abscess without bleeding: Secondary | ICD-10-CM

## 2019-08-28 DIAGNOSIS — Z23 Encounter for immunization: Secondary | ICD-10-CM | POA: Diagnosis not present

## 2019-08-28 DIAGNOSIS — Z79899 Other long term (current) drug therapy: Secondary | ICD-10-CM | POA: Diagnosis not present

## 2019-08-28 DIAGNOSIS — K5900 Constipation, unspecified: Secondary | ICD-10-CM | POA: Diagnosis present

## 2019-08-28 DIAGNOSIS — K5720 Diverticulitis of large intestine with perforation and abscess without bleeding: Secondary | ICD-10-CM | POA: Diagnosis not present

## 2019-08-28 DIAGNOSIS — K5730 Diverticulosis of large intestine without perforation or abscess without bleeding: Secondary | ICD-10-CM | POA: Diagnosis not present

## 2019-08-28 HISTORY — DX: Diverticulitis of intestine, part unspecified, without perforation or abscess without bleeding: K57.92

## 2019-08-28 LAB — COMPREHENSIVE METABOLIC PANEL
ALT: 13 U/L (ref 0–44)
AST: 16 U/L (ref 15–41)
Albumin: 3.5 g/dL (ref 3.5–5.0)
Alkaline Phosphatase: 66 U/L (ref 38–126)
Anion gap: 14 (ref 5–15)
BUN: 14 mg/dL (ref 8–23)
CO2: 21 mmol/L — ABNORMAL LOW (ref 22–32)
Calcium: 8.9 mg/dL (ref 8.9–10.3)
Chloride: 104 mmol/L (ref 98–111)
Creatinine, Ser: 0.86 mg/dL (ref 0.44–1.00)
GFR calc Af Amer: >60 mL/min (ref >60)
GFR calc non Af Amer: >60 mL/min (ref >60)
Glucose, Bld: 135 mg/dL — ABNORMAL HIGH (ref 70–99)
Potassium: 3.3 mmol/L — ABNORMAL LOW (ref 3.5–5.1)
Sodium: 139 mmol/L (ref 135–145)
Total Bilirubin: 0.8 mg/dL (ref 0.3–1.2)
Total Protein: 7.2 g/dL (ref 6.5–8.1)

## 2019-08-28 LAB — CBC
HCT: 43 % (ref 36.0–46.0)
Hemoglobin: 14.5 g/dL (ref 12.0–15.0)
MCH: 28.5 pg (ref 26.0–34.0)
MCHC: 33.7 g/dL (ref 30.0–36.0)
MCV: 84.5 fL (ref 80.0–100.0)
Platelets: 278 10*3/uL (ref 150–400)
RBC: 5.09 MIL/uL (ref 3.87–5.11)
RDW: 11.7 % (ref 11.5–15.5)
WBC: 6.9 10*3/uL (ref 4.0–10.5)
nRBC: 0 % (ref 0.0–0.2)

## 2019-08-28 LAB — URINALYSIS, ROUTINE W REFLEX MICROSCOPIC
Bacteria, UA: NONE SEEN
Bilirubin Urine: NEGATIVE
Glucose, UA: NEGATIVE mg/dL
Hgb urine dipstick: NEGATIVE
Ketones, ur: 20 mg/dL — AB
Nitrite: NEGATIVE
Protein, ur: NEGATIVE mg/dL
Specific Gravity, Urine: >1.046 — ABNORMAL HIGH (ref 1.005–1.030)
pH: 5 (ref 5.0–8.0)

## 2019-08-28 LAB — LACTIC ACID, PLASMA
Lactic Acid, Venous: 1.2 mmol/L (ref 0.5–1.9)
Lactic Acid, Venous: 1.1 mmol/L (ref 0.5–1.9)

## 2019-08-28 LAB — HIV ANTIBODY (ROUTINE TESTING W REFLEX): HIV Screen 4th Generation wRfx: NONREACTIVE

## 2019-08-28 LAB — LIPASE, BLOOD: Lipase: 18 U/L (ref 11–51)

## 2019-08-28 LAB — MAGNESIUM: Magnesium: 2 mg/dL (ref 1.7–2.4)

## 2019-08-28 MED ORDER — SODIUM CHLORIDE 0.9 % IV BOLUS (SEPSIS)
1000.0000 mL | Freq: Once | INTRAVENOUS | Status: AC
  Administered 2019-08-28: 16:00:00 1000 mL via INTRAVENOUS

## 2019-08-28 MED ORDER — IOHEXOL 300 MG/ML  SOLN
100.0000 mL | Freq: Once | INTRAMUSCULAR | Status: AC | PRN
  Administered 2019-08-28: 100 mL via INTRAVENOUS

## 2019-08-28 MED ORDER — HYDROMORPHONE HCL 1 MG/ML IJ SOLN: 0.5000 mg | INTRAMUSCULAR | Status: DC | PRN

## 2019-08-28 MED ORDER — ONDANSETRON HCL 4 MG/2ML IJ SOLN
4.0000 mg | Freq: Once | INTRAMUSCULAR | Status: AC
  Administered 2019-08-28: 4 mg via INTRAVENOUS
  Filled 2019-08-28: qty 2

## 2019-08-28 MED ORDER — POTASSIUM CHLORIDE IN NACL 20-0.9 MEQ/L-% IV SOLN
INTRAVENOUS | Status: DC
  Filled 2019-08-28 (×2): qty 1000

## 2019-08-28 MED ORDER — PIPERACILLIN-TAZOBACTAM 3.375 G IVPB
3.3750 g | Freq: Three times a day (TID) | INTRAVENOUS | Status: DC
  Administered 2019-08-29 – 2019-08-31 (×7): 3.375 g via INTRAVENOUS
  Filled 2019-08-28 (×8): qty 50

## 2019-08-28 MED ORDER — SODIUM CHLORIDE 0.9 % IV SOLN
1000.0000 mL | INTRAVENOUS | Status: DC
  Administered 2019-08-28: 1000 mL via INTRAVENOUS

## 2019-08-28 MED ORDER — ONDANSETRON HCL 4 MG/2ML IJ SOLN
4.0000 mg | Freq: Four times a day (QID) | INTRAMUSCULAR | Status: DC | PRN
  Administered 2019-08-30: 4 mg via INTRAVENOUS
  Filled 2019-08-28: qty 2

## 2019-08-28 MED ORDER — ACETAMINOPHEN 650 MG RE SUPP: 650.0000 mg | Freq: Four times a day (QID) | RECTAL | Status: DC | PRN

## 2019-08-28 MED ORDER — ONDANSETRON HCL 4 MG PO TABS: 4.0000 mg | ORAL_TABLET | Freq: Four times a day (QID) | ORAL | Status: DC | PRN

## 2019-08-28 MED ORDER — PIPERACILLIN-TAZOBACTAM 3.375 G IVPB 30 MIN
3.3750 g | Freq: Once | INTRAVENOUS | Status: AC
  Administered 2019-08-28: 18:00:00 3.375 g via INTRAVENOUS
  Filled 2019-08-28: qty 50

## 2019-08-28 MED ORDER — ACETAMINOPHEN 325 MG PO TABS: 650.0000 mg | ORAL_TABLET | Freq: Four times a day (QID) | ORAL | Status: DC | PRN

## 2019-08-28 NOTE — ED Provider Notes (Signed)
[facility] EMERGENCY DEPARTMENT Provider Note   CSN: [id number] Arrival date & time: 08/28/19  1315     History Chief Complaint  Patient presents with  . Abdominal Pain  . Emesis  . Diarrhea    Katie Blake is a 69 y.o. female.  HPI   Pt started having symptoms this past week.  She has been having abd pain, vomiting and diarrhea.  The pain is in the llq but goes to both sides.  She has had diverticulitis in the past and the pt felt like she was having an exacerbation.  She saw her doctor on 4/12 and was prescribed cipro and flagyl.  The pain  has been getting better since she started taking abx.  The nausea has persisted and she has been dry heaving a lot.  She is not eating much.  She tried to eat crackers this am and she vomited. .  She is now feeling weak.  She called her doctor who told her to come to theED.    Past Medical History:  Diagnosis Date  . Diverticulitis     Patient Active Problem List   Diagnosis Date Noted  . High cholesterol 07/03/2018  . Common migraine without intractability 08/18/2016  . History of diverticulitis 09/07/2013    Past Surgical History:  Procedure Laterality Date  .  shoulder surgery  1988    rotator cuff, bilateral  . TARSAL TUNNEL RELEASE     left     OB History   No obstetric history on file.     Family History  Problem Relation Age of Onset  . Diabetes Mother   . Cancer Father 33        brain tumor  . Asthma Brother     Social History   Tobacco Use  . Smoking status: Never Smoker  . Smokeless tobacco: Never Used  Substance Use Topics  . Alcohol use: No  . Drug use: No    Home Medications Prior to Admission medications   Medication Sig Start Date End Date Taking? Authorizing Provider  acetaminophen (TYLENOL) 500 MG tablet Take 1,000 mg by mouth daily as needed (for pain).   Yes [provider]  ciprofloxacin (CIPRO) 750 MG tablet Take 1 tablet (750 mg total) by mouth 2 (two) times  daily for 10 days. 08/26/19 09/05/19 Yes Copland, Karleen Hampshire, MD  dimenhyDRINATE (DRAMAMINE) 50 MG tablet Take 50 mg by mouth every 8 (eight) hours as needed for nausea or dizziness.   Yes [provider]  metroNIDAZOLE (FLAGYL) 500 MG tablet Take 1 tablet (500 mg total) by mouth 3 (three) times daily for 10 days. 08/26/19 09/05/19 Yes Copland, Karleen Hampshire, MD    Allergies    Sulfa antibiotics  Review of Systems   Review of Systems  All other systems reviewed and are negative.   Physical Exam Updated Vital Signs BP (!) 133/93 (BP Location: Left Arm)   Pulse 73   Temp 98.4 F (36.9 C) (Oral)   Resp 17   SpO2 98%   Physical Exam Vitals and nursing note reviewed.  Constitutional:      General: She is not in acute distress.    Appearance: She is well-developed.  HENT:     Head: Normocephalic and atraumatic.     Right Ear: External ear normal.     Left Ear: External ear normal.  Eyes:     General: No scleral icterus.       Right eye: No discharge.  Left eye: No discharge.     Conjunctiva/sclera: Conjunctivae normal.  Neck:     Trachea: No tracheal deviation.  Cardiovascular:     Rate and Rhythm: Normal rate and regular rhythm.  Pulmonary:     Effort: Pulmonary effort is normal. No respiratory distress.     Breath sounds: Normal breath sounds. No stridor. No wheezing or rales.  Abdominal:     General: Bowel sounds are normal. There is no distension.     Palpations: Abdomen is soft.     Tenderness: There is abdominal tenderness in the left lower quadrant. There is no guarding or rebound.  Musculoskeletal:        General: No tenderness.     Cervical back: Neck supple.  Skin:    General: Skin is warm and dry.     Findings: No rash.  Neurological:     Mental Status: She is alert.     Cranial Nerves: No cranial nerve deficit (no facial droop, extraocular movements intact, no slurred speech).     Sensory: No sensory deficit.     Motor: No abnormal muscle tone or  seizure activity.     Coordination: Coordination normal.     ED Results / Procedures / Treatments   Labs (all labs ordered are listed, but only abnormal results are displayed) Labs Reviewed  COMPREHENSIVE METABOLIC PANEL - Abnormal; Notable for the following components:      Result Value   Potassium 3.3 (*)    CO2 21 (*)    Glucose, Bld 135 (*)    All other components within normal limits  SARS CORONAVIRUS 2 (TAT 6-24 HRS)  LIPASE, BLOOD  CBC  URINALYSIS, ROUTINE W REFLEX MICROSCOPIC    EKG None  Radiology CT ABDOMEN PELVIS W CONTRAST  Result Date: 08/28/2019 CLINICAL DATA:  Diverticulitis suspected EXAM: CT ABDOMEN AND PELVIS WITH CONTRAST TECHNIQUE: Multidetector CT imaging of the abdomen and pelvis was performed using the standard protocol following bolus administration of intravenous contrast. CONTRAST:  157mL OMNIPAQUE IOHEXOL 300 MG/ML  SOLN COMPARISON:  Abdominal radiograph Sep 17, 2013 FINDINGS: Lower chest: Lung bases are clear. Normal heart size. No pericardial effusion. Hepatobiliary: No focal liver abnormality is seen. No gallstones, gallbladder wall thickening, or biliary dilatation. Pancreas: Partial fatty replacement of the pancreas. Spleen: Normal in size without focal abnormality. Small accessory splenule. Adrenals/Urinary Tract: Normal adrenal glands. Few subcentimeter hypoattenuating foci in the kidneys too small to fully characterize on CT imaging but statistically likely benign. Kidneys are otherwise unremarkable, without renal calculi, suspicious lesion, or hydronephrosis. Intramural fat noted within the anterior aspect of the bladder wall, may reflect sequela of prior infection or inflammation. Stomach/Bowel: Distal esophagus, stomach and duodenal sweep are unremarkable. No small bowel wall thickening or dilatation. No evidence of obstruction. A normal appendix is visualized. Much of the proximal colon is fluid-filled. There are numerous distal colonic diverticula  including a focally thickened segment of the mid to distal sigmoid arising near the rectosigmoid junction is an inflamed rim enhancing outpouching which may reflect an inflamed colonic diverticula versus contained perforation/intramural abscess which does demonstrate significant surrounding phlegmonous change as well as loss of discernible fat plane with an adjacent loop of the more proximal sigmoid colon as well as the dorsal aspect of the uterus. No extraluminal gas is evident. No extraluminal free fluid is evident. Vascular/Lymphatic: Atherosclerotic plaque within the normal caliber aorta. Reactive adenopathy noted in the deep pelvis in the region of extensive pericolonic inflammation. Reproductive: The anteverted uterus. Loss  of discernible fat plane of the posterior/dorsal uterine body and the adjacent inflammatory changes of the sigmoid. No other concerning adnexal abnormality. Other: No free fluid or free air. Extensive phlegmonous change in the deep pelvis, as detailed above. No bowel containing hernias. Musculoskeletal: Multilevel degenerative changes are present in the imaged portions of the spine. No acute osseous abnormality or suspicious osseous lesion. IMPRESSION: 1. Rim enhancing collection arising from the distal sigmoid just proximal to the rectosigmoid junction with extensive surrounding inflammatory/phlegmonous change. This may reflect a contained perforated diverticulum though the extent of inflammatory changes is quite pronounced and does result in reactive thickening and inflammation of a more proximal loop of sigmoid colon as well as loss of discernible fat planes with the posterior/dorsal aspect of the uterus. Developing colo-colonic fistulization is not fully excluded particularly in the absence of luminal contrast media. No extraluminal gas or free fluid is evident. Given this complicated appearance, surgical consultation may be warranted. Furthermore, after resolution of acute symptoms  direct visualization of the colon is recommended. 2. Intramural fat within the anterior aspect of the bladder wall, may reflect sequela of prior infection or inflammation, correlate with patient's symptoms. 3. Aortic Atherosclerosis (ICD10-I70.0). These results were called by telephone at the time of interpretation on 08/28/2019 at 4:56 pm to provider [facility] , who verbally acknowledged these results. Electronically Signed   By: Kreg Shropshire M.D.   On: 08/28/2019 16:57    Procedures Procedures (including critical care time)  Medications Ordered in ED Medications  sodium chloride 0.9 % bolus 1,000 mL (0 mLs Intravenous Stopped 08/28/19 1724)    Followed by  0.9 %  sodium chloride infusion (1,000 mLs Intravenous New Bag/Given 08/28/19 1730)  piperacillin-tazobactam (ZOSYN) IVPB 3.375 g (3.375 g Intravenous New Bag/Given 08/28/19 1730)  ondansetron (ZOFRAN) injection 4 mg (4 mg Intravenous Given 08/28/19 1534)  iohexol (OMNIPAQUE) 300 MG/ML solution 100 mL (100 mLs Intravenous Contrast Given 08/28/19 1618)    ED Course  I have reviewed the triage vital signs and the nursing notes.  Pertinent labs & imaging results that were available during my care of the patient were reviewed by me and considered in my medical decision making (see chart for details).  Clinical Course as of Aug 28 1734  Wed Aug 28, 2019  1732 Labs reviewed.  No significant abnormalities.  CT scan findings reviewed with radiology.  Patient has complicated findings suggesting phlegmon or possible colocolonic fistula.  No discrete abscess or free air   [JK]  1732 Case discussed with Dr. Guerry Minors team, he is currently in the OR.  He will consult on the patient   [JK]    Clinical Course User Index [JK] Linwood Dibbles, MD   MDM Rules/Calculators/A&P                      Patient presented for worsening symptoms associated with presumptive diverticulitis.  CT scan with complex phlegmon.  Pt started on IV abx.  General surgery  consulted.  Admit to medical service for further treatment. Final Clinical Impression(s) / ED Diagnoses Final diagnoses:  Acute diverticulitis     Linwood Dibbles, MD 08/30/19 1549

## 2019-08-28 NOTE — Telephone Encounter (Signed)
Seen by Dr Patsy Lager on 08/26/2019 for Acute Diverticulitis flare.  Pt states that her vomiting is worsening and she cannot keep anything down - vomiting is so severe that she cannot drink anything now, she is having take Abx on an empty stomach which is making her even more sick. Pt states that she has tried sipping water, cannot drink ginger ale or gatorade - makes her gag. Pt states that she has tried sucking on ice cubes but she gags until she vomits. Pt states that she had been able to sip on soda but she still ends up vomiting. Pt states that she cannot keep a saltine cracker down long enough to nibble it and take her medication. Having diarrhea as well. Headache and dizziness. Denies fever.   Is there anything she can try to reduce the vomiting so that she can keep fluids and food down? She is getting dehydrated and symptoms are getting worse.   Please advise, thanks.

## 2019-08-28 NOTE — Consult Note (Signed)
Reason for Consult: abdominal pain Referring Physician: [name] is an 69 y.o. female.  HPI: 69 yo female with 4 day history of lower abdominal pain and nausea. Nausea is the more pronounced symptom. Pain is constant and worse in the last 3 days. It does not radiate. It is worse with movement. She has some diarrhea and constipation. Pain and nausea did not improve with antibiotics. She denies blood in the stool.  Past Medical History:  Diagnosis Date  . Diverticulitis     Past Surgical History:  Procedure Laterality Date  .  shoulder surgery  1988    rotator cuff, bilateral  . TARSAL TUNNEL RELEASE     left    Family History  Problem Relation Age of Onset  . Diabetes Mother   . Cancer Father 57        brain tumor  . Asthma Brother     Social History:  reports that she has never smoked. She has never used smokeless tobacco. She reports that she does not drink alcohol or use drugs.  Allergies:  Allergies  Allergen Reactions  . Sulfa Antibiotics     Medications: I have reviewed the patient's current medications.  Results for orders placed or performed during the hospital encounter of 08/28/19 (from the past 48 hour(s))  Lipase, blood     Status: None   Collection Time: 08/28/19  1:38 PM  Result Value Ref Range   Lipase 18 11 - 51 U/L    Comment: Performed at [facility], [address] [place], [place] [address]  Comprehensive metabolic panel     Status: Abnormal   Collection Time: 08/28/19  1:38 PM  Result Value Ref Range   Sodium 139 135 - 145 mmol/L   Potassium 3.3 (L) 3.5 - 5.1 mmol/L   Chloride 104 98 - 111 mmol/L   CO2 21 (L) 22 - 32 mmol/L   Glucose, Bld 135 (H) 70 - 99 mg/dL    Comment: Glucose reference range applies only to samples taken after fasting for at least 8 hours.   BUN 14 8 - 23 mg/dL   Creatinine, Ser 6.04 0.44 - 1.00 mg/dL   Calcium 8.9 8.9 - 54.0 mg/dL   Total Protein 7.2 6.5 - 8.1 g/dL   Albumin 3.5 3.5 - 5.0 g/dL     AST 16 15 - 41 U/L   ALT 13 0 - 44 U/L   Alkaline Phosphatase 66 38 - 126 U/L   Total Bilirubin 0.8 0.3 - 1.2 mg/dL   GFR calc non Af Amer >60 >60 mL/min   GFR calc Af Amer >60 >60 mL/min   Anion gap 14 5 - 15    Comment: Performed at [facility], [address], [place], [place] [address]  CBC     Status: None   Collection Time: 08/28/19  1:38 PM  Result Value Ref Range   WBC 6.9 4.0 - 10.5 K/uL   RBC 5.09 3.87 - 5.11 MIL/uL   Hemoglobin 14.5 12.0 - 15.0 g/dL   HCT 14.7 82.9 - 56.2 %   MCV 84.5 80.0 - 100.0 fL   MCH 28.5 26.0 - 34.0 pg   MCHC 33.7 30.0 - 36.0 g/dL   RDW 13.0 86.5 - 78.4 %   Platelets 278 150 - 400 K/uL   nRBC 0.0 0.0 - 0.2 %    Comment: Performed at [facility], [address] [place], [place]  [address]    CT ABDOMEN PELVIS W CONTRAST  Result Date: 08/28/2019 CLINICAL DATA:  Diverticulitis suspected EXAM: CT ABDOMEN AND PELVIS WITH CONTRAST TECHNIQUE: Multidetector CT imaging of the abdomen and pelvis was performed using the standard protocol following bolus administration of intravenous contrast. CONTRAST:  OMNIPAQUE IOHEXOL 300 MG/ML  SOLN COMPARISON:  Abdominal radiograph Sep 17, 2013 FINDINGS: Lower chest: Lung bases are clear. Normal heart size. No pericardial effusion. Hepatobiliary: No focal liver abnormality is seen. No gallstones, gallbladder wall thickening, or biliary dilatation. Pancreas: Partial fatty replacement of the pancreas. Spleen: Normal in size without focal abnormality. Small accessory splenule. Adrenals/Urinary Tract: Normal adrenal glands. Few subcentimeter hypoattenuating foci in the kidneys too small to fully characterize on CT imaging but statistically likely benign. Kidneys are otherwise unremarkable, without renal calculi, suspicious lesion, or hydronephrosis. Intramural fat noted within the anterior aspect of the bladder wall, may reflect sequela of prior infection or inflammation. Stomach/Bowel: Distal esophagus,  stomach and duodenal sweep are unremarkable. No small bowel wall thickening or dilatation. No evidence of obstruction. A normal appendix is visualized. Much of the proximal colon is fluid-filled. There are numerous distal colonic diverticula including a focally thickened segment of the mid to distal sigmoid arising near the rectosigmoid junction is an inflamed rim enhancing outpouching which may reflect an inflamed colonic diverticula versus contained perforation/intramural abscess which does demonstrate significant surrounding phlegmonous change as well as loss of discernible fat plane with an adjacent loop of the more proximal sigmoid colon as well as the dorsal aspect of the uterus. No extraluminal gas is evident. No extraluminal free fluid is evident. Vascular/Lymphatic: Atherosclerotic plaque within the normal caliber aorta. Reactive adenopathy noted in the deep pelvis in the region of extensive pericolonic inflammation. Reproductive: The anteverted uterus. Loss of discernible fat plane of the posterior/dorsal uterine body and the adjacent inflammatory changes of the sigmoid. No other concerning adnexal abnormality. Other: No free fluid or free air. Extensive phlegmonous change in the deep pelvis, as detailed above. No bowel containing hernias. Musculoskeletal: Multilevel degenerative changes are present in the imaged portions of the spine. No acute osseous abnormality or suspicious osseous lesion. IMPRESSION: 1. Rim enhancing collection arising from the distal sigmoid just proximal to the rectosigmoid junction with extensive surrounding inflammatory/phlegmonous change. This may reflect a contained perforated diverticulum though the extent of inflammatory changes is quite pronounced and does result in reactive thickening and inflammation of a more proximal loop of sigmoid colon as well as loss of discernible fat planes with the posterior/dorsal aspect of the uterus. Developing colo-colonic fistulization is not  fully excluded particularly in the absence of luminal contrast media. No extraluminal gas or free fluid is evident. Given this complicated appearance, surgical consultation may be warranted. Furthermore, after resolution of acute symptoms direct visualization of the colon is recommended. 2. Intramural fat within the anterior aspect of the bladder wall, may reflect sequela of prior infection or inflammation, correlate with patient's symptoms. 3. Aortic Atherosclerosis (ICD10-I70.0). These results were called by telephone at the time of interpretation on 08/28/2019 at 4:56 pm to provider [facility] , who verbally acknowledged these results. Electronically Signed   By: Kreg Shropshire M.D.   On: 08/28/2019 16:57    Review of Systems  Gastrointestinal: Positive for abdominal pain, diarrhea and nausea.  Neurological: Positive for dizziness.  All other systems reviewed and are negative.   PE Blood pressure (!) 133/93, pulse 73, temperature 98.4 F (36.9 C), temperature source Oral, resp. rate 17, SpO2 98 %.  Constitutional: NAD; conversant; no deformities Eyes: Moist conjunctiva; no lid lag; anicteric; PERRL Neck: Trachea midline; no thyromegaly Lungs: Normal respiratory effort; no tactile fremitus CV: RRR; no palpable thrills; no pitting edema GI: Abd suprapubic tenderness, slight tenderness left side; no palpable hepatosplenomegaly MSK: Normal gait; no clubbing/cyanosis Psychiatric: Appropriate affect; alert and oriented x3 Lymphatic: No palpable cervical or axillary lymphadenopathy   Assessment/Plan: 69 yo female with acute diverticulitis not responsive to outpatient therapy. She has tenderness and described lightheadedness. CT showing focal sigmoid diverticulitis question of intraluminal abscess, not large enough or organized enough for drainage. -recommend medical admission -IV zosyn -pain control -bowel rest -discussed likelihood of antibiotics being successful with possible repeat imaging if  failure to improve or possible surgery if clinically worsens. -recommend outpatient colonoscopy in 6-8 weeks  Arta Bruce Bearl Talarico 08/28/2019, 6:36 PM

## 2019-08-28 NOTE — Telephone Encounter (Signed)
Ms. Comes notified as instructed by telephone.   Patient states understanding.  She will have someone take her to the ER now.

## 2019-08-28 NOTE — Progress Notes (Signed)
Pharmacy Antibiotic Note  Katie Blake is a 69 y.o. female admitted on 08/28/2019 with diverticulitis, failed cipro/flagyl PTA.  Pharmacy has been consulted for zosyn dosing.  WBC wnl, AF, vitals stable.    Plan: Zosyn 3.375g IV every 8 hours (extended infusion) Monitor renal function, GI workup and LOT     Temp (24hrs), Avg:98.4 F (36.9 C), Min:98.4 F (36.9 C), Max:98.4 F (36.9 C)  Recent Labs  Lab 08/28/19 1338  WBC 6.9  CREATININE 0.86    Estimated Creatinine Clearance: 67.3 mL/min (by C-G formula based on SCr of 0.86 mg/dL).    Allergies  Allergen Reactions  . Sulfa Antibiotics     Daylene Posey, PharmD Clinical Pharmacist ED Pharmacist Phone # [phone] 08/28/2019 6:23 PM

## 2019-08-28 NOTE — Telephone Encounter (Signed)
I think she should go to the ER.  If she can't drink or keep anything down and she is feeling significantly worse, I think she needs a higher level of care.  May need more testing, IV (fluids maybe antibiotics).

## 2019-08-28 NOTE — H&P (Addendum)
History and Physical    Katie Blake:[id number] DOB: [birth year] DOA: 08/28/2019  PCP: Excell Seltzer, MD  Patient coming from: Home I have personally briefly reviewed patient's old medical records in [facility]  Chief Complaint: Abdominal pain, nausea, vomiting and diarrhea  HPI: Katie Blake is a 69 y.o. female with medical history significant of recurrent diverticulitis presents to emergency department due to abdominal pain, vomiting and diarrhea.  Reports lower abdominal pain more on left side, associated with nausea, vomiting, diarrhea.  She had 5-6 episodes of diverticulitis in the past and felt like she has having a same episode.  She was seen by her PCP on 08/26/2019 and was prescribed Cipro and Flagyl.  Patient tells me that her pain improves with antibiotics however she constantly having nausea, vomiting, decreased appetite.  She called her doctor this morning and was advised to go to ER for further evaluation and management.  She started taking Cipro and Flagyl on Monday as prescribed by her PCP.  Has watery diarrhea since 1 week, brown in color, foul-smelling, nonbloody.  She tells me that she always has watery diarrhea whenever she has diverticulitis.  No headache, blurry vision, chest pain, shortness of breath, fever, chills, sleep changes.  Reports dark-colored urine however denies association with dysuria, hematuria, foul-smelling or back pain.  No history of smoking, alcohol, illicit drug use.  ED Course: Upon arrival to ED: Patient vital signs stable, afebrile with no leukocytosis.  CMP shows potassium of 3.3.  Lipase: WNL, COVID-19 pending.  CT abdomen/pelvis shows: Rim enhancing collection arising from the distal sigmoid just proximal to the rectosigmoid junction with extensive surrounding inflammatory/phlegmonous change. This may reflect a contained perforated diverticulum though the extent of inflammatory changes is quite pronounced and does result in reactive  thickening and inflammation of a more proximal loop of sigmoid colon as well as loss of discernible fat planes with the posterior/dorsal aspect of the uterus. Developing colo-colonic fistulization is not fully excluded particularly in the absence of luminal contrast media. No extraluminal gas or free fluid is evident. EDP consulted general surgery.  Patient received IV fluid, Zofran and Zosyn in the ED.    [facility] consulted for admission for acute diverticulitis.  Review of Systems: As per HPI otherwise negative.    Past Medical History:  Diagnosis Date  . Diverticulitis     Past Surgical History:  Procedure Laterality Date  .  shoulder surgery  1988    rotator cuff, bilateral  . TARSAL TUNNEL RELEASE     left     reports that she has never smoked. She has never used smokeless tobacco. She reports that she does not drink alcohol or use drugs.  Allergies  Allergen Reactions  . Sulfa Antibiotics     Family History  Problem Relation Age of Onset  . Diabetes Mother   . Cancer Father 53        brain tumor  . Asthma Brother     Prior to Admission medications   Medication Sig Start Date End Date Taking? Authorizing Provider  acetaminophen (TYLENOL) 500 MG tablet Take 1,000 mg by mouth daily as needed (for pain).   Yes [provider]  ciprofloxacin (CIPRO) 750 MG tablet Take 1 tablet (750 mg total) by mouth 2 (two) times daily for 10 days. 08/26/19 09/05/19 Yes Copland, Karleen Hampshire, MD  dimenhyDRINATE (DRAMAMINE) 50 MG tablet Take 50 mg by mouth every 8 (eight) hours as needed for nausea or dizziness.  Yes [provider]  metroNIDAZOLE (FLAGYL) 500 MG tablet Take 1 tablet (500 mg total) by mouth 3 (three) times daily for 10 days. 08/26/19 09/05/19 Yes Copland, Karleen Hampshire, MD    Physical Exam: Vitals:   08/28/19 1329 08/28/19 1528 08/28/19 1725  BP: 135/86 (!) 151/88 (!) 133/93  Pulse: 97 77 73  Resp: 20 17 17   Temp: 98.4 F (36.9 C)    TempSrc: Oral      SpO2: 95% 97% 98%    Constitutional: NAD, calm, comfortable, on room air, communicating well Eyes: PERRL, lids and conjunctivae normal ENMT: Mucous membranes are moist. Posterior pharynx clear of any exudate or lesions.Normal dentition.  Neck: normal, supple, no masses, no thyromegaly Respiratory: clear to auscultation bilaterally, no wheezing, no crackles. Normal respiratory effort. No accessory muscle use.  Cardiovascular: Regular rate and rhythm, no murmurs / rubs / gallops. No extremity edema. 2+ pedal pulses. No carotid bruits.  Abdomen: Left lower quadrant tenderness positive.  No guarding, no rigidity.  Bowel sounds positive. Musculoskeletal: no clubbing / cyanosis. No joint deformity upper and lower extremities. Good ROM, no contractures. Normal muscle tone.  Skin: no rashes, lesions, ulcers. No induration Neurologic: CN 2-12 grossly intact. Sensation intact, DTR normal. Strength 5/5 in all 4.  Psychiatric: Normal judgment and insight. Alert and oriented x 3. Normal mood.    Labs on Admission: I have personally reviewed following labs and imaging studies  CBC: Recent Labs  Lab 08/28/19 1338  WBC 6.9  HGB 14.5  HCT 43.0  MCV 84.5  PLT 278   Basic Metabolic Panel: Recent Labs  Lab 08/28/19 1338  NA 139  K 3.3*  CL 104  CO2 21*  GLUCOSE 135*  BUN 14  CREATININE 0.86  CALCIUM 8.9   GFR: Estimated Creatinine Clearance: 67.3 mL/min (by C-G formula based on SCr of 0.86 mg/dL). Liver Function Tests: Recent Labs  Lab 08/28/19 1338  AST 16  ALT 13  ALKPHOS 66  BILITOT 0.8  PROT 7.2  ALBUMIN 3.5   Recent Labs  Lab 08/28/19 1338  LIPASE 18   No results for input(s): AMMONIA in the last 168 hours. Coagulation Profile: No results for input(s): INR, PROTIME in the last 168 hours. Cardiac Enzymes: No results for input(s): CKTOTAL, CKMB, CKMBINDEX, TROPONINI in the last 168 hours. BNP (last 3 results) No results for input(s): PROBNP in the last 8760  hours. HbA1C: No results for input(s): HGBA1C in the last 72 hours. CBG: No results for input(s): GLUCAP in the last 168 hours. Lipid Profile: No results for input(s): CHOL, HDL, LDLCALC, TRIG, CHOLHDL, LDLDIRECT in the last 72 hours. Thyroid Function Tests: No results for input(s): TSH, T4TOTAL, FREET4, T3FREE, THYROIDAB in the last 72 hours. Anemia Panel: No results for input(s): VITAMINB12, FOLATE, FERRITIN, TIBC, IRON, RETICCTPCT in the last 72 hours. Urine analysis:    Component Value Date/Time   BILIRUBINUR small 09/07/2013 1842   PROTEINUR 30 09/07/2013 1842   UROBILINOGEN 1.0 09/07/2013 1842   NITRITE neg 09/07/2013 1842   LEUKOCYTESUR Negative 09/07/2013 1842    Radiological Exams on Admission: CT ABDOMEN PELVIS W CONTRAST  Result Date: 08/28/2019 CLINICAL DATA:  Diverticulitis suspected EXAM: CT ABDOMEN AND PELVIS WITH CONTRAST TECHNIQUE: Multidetector CT imaging of the abdomen and pelvis was performed using the standard protocol following bolus administration of intravenous contrast. CONTRAST:  08/30/2019 OMNIPAQUE IOHEXOL 300 MG/ML  SOLN COMPARISON:  Abdominal radiograph Sep 17, 2013 FINDINGS: Lower chest: Lung bases are clear. Normal heart size. No pericardial  effusion. Hepatobiliary: No focal liver abnormality is seen. No gallstones, gallbladder wall thickening, or biliary dilatation. Pancreas: Partial fatty replacement of the pancreas. Spleen: Normal in size without focal abnormality. Small accessory splenule. Adrenals/Urinary Tract: Normal adrenal glands. Few subcentimeter hypoattenuating foci in the kidneys too small to fully characterize on CT imaging but statistically likely benign. Kidneys are otherwise unremarkable, without renal calculi, suspicious lesion, or hydronephrosis. Intramural fat noted within the anterior aspect of the bladder wall, may reflect sequela of prior infection or inflammation. Stomach/Bowel: Distal esophagus, stomach and duodenal sweep are unremarkable. No  small bowel wall thickening or dilatation. No evidence of obstruction. A normal appendix is visualized. Much of the proximal colon is fluid-filled. There are numerous distal colonic diverticula including a focally thickened segment of the mid to distal sigmoid arising near the rectosigmoid junction is an inflamed rim enhancing outpouching which may reflect an inflamed colonic diverticula versus contained perforation/intramural abscess which does demonstrate significant surrounding phlegmonous change as well as loss of discernible fat plane with an adjacent loop of the more proximal sigmoid colon as well as the dorsal aspect of the uterus. No extraluminal gas is evident. No extraluminal free fluid is evident. Vascular/Lymphatic: Atherosclerotic plaque within the normal caliber aorta. Reactive adenopathy noted in the deep pelvis in the region of extensive pericolonic inflammation. Reproductive: The anteverted uterus. Loss of discernible fat plane of the posterior/dorsal uterine body and the adjacent inflammatory changes of the sigmoid. No other concerning adnexal abnormality. Other: No free fluid or free air. Extensive phlegmonous change in the deep pelvis, as detailed above. No bowel containing hernias. Musculoskeletal: Multilevel degenerative changes are present in the imaged portions of the spine. No acute osseous abnormality or suspicious osseous lesion. IMPRESSION: 1. Rim enhancing collection arising from the distal sigmoid just proximal to the rectosigmoid junction with extensive surrounding inflammatory/phlegmonous change. This may reflect a contained perforated diverticulum though the extent of inflammatory changes is quite pronounced and does result in reactive thickening and inflammation of a more proximal loop of sigmoid colon as well as loss of discernible fat planes with the posterior/dorsal aspect of the uterus. Developing colo-colonic fistulization is not fully excluded particularly in the absence of  luminal contrast media. No extraluminal gas or free fluid is evident. Given this complicated appearance, surgical consultation may be warranted. Furthermore, after resolution of acute symptoms direct visualization of the colon is recommended. 2. Intramural fat within the anterior aspect of the bladder wall, may reflect sequela of prior infection or inflammation, correlate with patient's symptoms. 3. Aortic Atherosclerosis (ICD10-I70.0). These results were called by telephone at the time of interpretation on 08/28/2019 at 4:56 pm to provider [facility] , who verbally acknowledged these results. Electronically Signed   By: Lovena Le M.D.   On: 08/28/2019 16:57    Assessment/Plan Active Problems:   Acute diverticulitis   Hypokalemia    Acute diverticulitis: -Complicated with possible ?perforated diverticulum with developing colocolonic fistulization -Patient has history of multiple episodes of diverticulitis in the past. -Failed outpatient Cipro and Flagyl therapy.  Currently she is afebrile with no leukocytosis.  Vital signs stable.  COVID-19 pending.  Lipase: WNL. -Reviewed CT abdomen/pelvis result. -Received IV fluids, Zofran and Zosyn in ED -Admit patient on the floor for close monitoring -Continue IV fluids, Zofran and Zosyn. -We will get lactic acid and blood culture -EDP consulted general surgery-await recommendations -We will keep her n.p.o. -Dilaudid as needed for pain control.  Monitor vital signs closely.  Hypokalemia: Likely secondary to vomiting and  diarrhea -Replenished.  Check magnesium level -Repeat BMP tomorrow a.m.  DVT prophylaxis: TED/SCD Code Status: Full code Family Communication: None present at bedside.  Plan of care discussed with patient in length and he verbalized understanding and agreed with it. Disposition Plan: Likely home in 2 to 3 days  consults called: General surgery Admission status: Inpatient   Ollen Bowl MD [facility] Pager [phone]  If 7PM-7AM, please contact night-coverage [email or web address] Password [facility]  08/28/2019, 6:03 PM

## 2019-08-28 NOTE — ED Triage Notes (Signed)
Pt reports abd pain, emesis and diarrhea for the past week, pt seen by PCP on Monday, given meds for diverticulitis, hx of same. Pt reports no improvement with meds and is not able to keep them down. No active vomiting in triage

## 2019-08-29 DIAGNOSIS — K5792 Diverticulitis of intestine, part unspecified, without perforation or abscess without bleeding: Secondary | ICD-10-CM | POA: Diagnosis not present

## 2019-08-29 DIAGNOSIS — U071 COVID-19: Secondary | ICD-10-CM

## 2019-08-29 DIAGNOSIS — E876 Hypokalemia: Secondary | ICD-10-CM | POA: Diagnosis not present

## 2019-08-29 LAB — SARS CORONAVIRUS 2 (TAT 6-24 HRS): SARS Coronavirus 2: POSITIVE — AB

## 2019-08-29 LAB — CBC
HCT: 40.7 % (ref 36.0–46.0)
Hemoglobin: 13.7 g/dL (ref 12.0–15.0)
MCH: 28.6 pg (ref 26.0–34.0)
MCHC: 33.7 g/dL (ref 30.0–36.0)
MCV: 85 fL (ref 80.0–100.0)
Platelets: 260 10*3/uL (ref 150–400)
RBC: 4.79 MIL/uL (ref 3.87–5.11)
RDW: 11.9 % (ref 11.5–15.5)
WBC: 5.8 10*3/uL (ref 4.0–10.5)
nRBC: 0 % (ref 0.0–0.2)

## 2019-08-29 LAB — COMPREHENSIVE METABOLIC PANEL
ALT: 13 U/L (ref 0–44)
AST: 20 U/L (ref 15–41)
Albumin: 3.1 g/dL — ABNORMAL LOW (ref 3.5–5.0)
Alkaline Phosphatase: 58 U/L (ref 38–126)
Anion gap: 11 (ref 5–15)
BUN: 12 mg/dL (ref 8–23)
CO2: 22 mmol/L (ref 22–32)
Calcium: 8.4 mg/dL — ABNORMAL LOW (ref 8.9–10.3)
Chloride: 107 mmol/L (ref 98–111)
Creatinine, Ser: 0.86 mg/dL (ref 0.44–1.00)
GFR calc Af Amer: >60 mL/min (ref >60)
GFR calc non Af Amer: >60 mL/min (ref >60)
Glucose, Bld: 92 mg/dL (ref 70–99)
Potassium: 3.3 mmol/L — ABNORMAL LOW (ref 3.5–5.1)
Sodium: 140 mmol/L (ref 135–145)
Total Bilirubin: 0.8 mg/dL (ref 0.3–1.2)
Total Protein: 6.3 g/dL — ABNORMAL LOW (ref 6.5–8.1)

## 2019-08-29 LAB — C-REACTIVE PROTEIN: CRP: 9 mg/dL — ABNORMAL HIGH (ref <1.0)

## 2019-08-29 LAB — D-DIMER, QUANTITATIVE: D-Dimer, Quant: 1.23 ug/mL-FEU — ABNORMAL HIGH (ref 0.00–0.50)

## 2019-08-29 MED ORDER — POTASSIUM CHLORIDE 10 MEQ/100ML IV SOLN
10.0000 meq | INTRAVENOUS | Status: DC
  Administered 2019-08-29: 10:00:00 10 meq via INTRAVENOUS
  Filled 2019-08-29: qty 100

## 2019-08-29 MED ORDER — LACTATED RINGERS IV SOLN: INTRAVENOUS | Status: DC

## 2019-08-29 MED ORDER — POTASSIUM CHLORIDE CRYS ER 20 MEQ PO TBCR
40.0000 meq | EXTENDED_RELEASE_TABLET | Freq: Once | ORAL | Status: AC
  Administered 2019-08-29: 40 meq via ORAL
  Filled 2019-08-29: qty 2

## 2019-08-29 MED ORDER — HYDROCODONE-ACETAMINOPHEN 5-325 MG PO TABS: 1.0000 | ORAL_TABLET | ORAL | Status: DC | PRN

## 2019-08-29 MED ORDER — ENOXAPARIN SODIUM 40 MG/0.4ML ~~LOC~~ SOLN
40.0000 mg | Freq: Every day | SUBCUTANEOUS | Status: DC
  Administered 2019-08-29 – 2019-08-31 (×3): 40 mg via SUBCUTANEOUS
  Filled 2019-08-29 (×3): qty 0.4

## 2019-08-29 NOTE — Progress Notes (Signed)
PROGRESS NOTE                                                                                                                                                                                                             Patient Demographics:    Katie Blake, is a 69 y.o. female, DOB - 04/04/1951, WGY:[id number]  Outpatient Primary MD for the patient is Excell Seltzer, MD    LOS - 1  Admit date - 08/28/2019    Chief Complaint  Patient presents with  . Abdominal Pain  . Emesis  . Diarrhea       Brief Narrative  Katie Blake is a 69 y.o. female with medical history significant of recurrent diverticulitis presents to emergency department due to abdominal pain, vomiting and diarrhea.  Work-up was consistent with acute diverticulitis with possible abscess and perforation and incidental COVID-19 infection.   Subjective:    Katie Blake today has, No headache, No chest pain, improved left lower quadrant abdominal pain - No Nausea, No new weakness tingling or numbness, no cough or shortness of breath   Assessment  & Plan :     1.  Acute diverticulitis with possible small abscess and perforation.  She has had multiple similar episodes in the past, clinically appears stable and exam appears to be nonconcerning, general surgery following and being treated conservatively.  Continue bowel rest with antibiotics.  Gentle hydration and monitor.  2. Acute COVID-19 infection - incidental finding, monitor clinically along with inflammatory markers.  Encouraged the patient to sit up in chair in the daytime use I-S and flutter valve for pulmonary toiletry and then prone in bed when at night.  Will advance activity and titrate down oxygen as possible.    SpO2: 94 %  Recent Labs  Lab 08/28/19 1735  SARSCOV2NAA POSITIVE*    3.  Hypokalemia.  Replace.    Condition - Fair  Family Communication  :  No Family  Code Status :  Full  Diet :     Diet Order            Diet clear liquid Room service appropriate? Yes; Fluid consistency: Thin  Diet effective now               Disposition Plan  : Stay in the hospital for the treatment of acute  diverticulitis with perforation  Consults  : General surgery  Procedures  :    CT abdomen pelvis.  1. Rim enhancing collection arising from the distal sigmoid just proximal to the rectosigmoid junction with extensive surrounding inflammatory/phlegmonous change. This may reflect a contained perforated diverticulum though the extent of inflammatory changes is quite pronounced and does result in reactive thickening and inflammation of a more proximal loop of sigmoid colon as well as loss of discernible fat planes with the posterior/dorsal aspect of the uterus. Developing colo-colonic fistulization is not fully excluded particularly in the absence of luminal contrast media. No extraluminal gas or free fluid is evident. Given this complicated appearance, surgical consultation may be warranted. Furthermore, after resolution of acute symptoms direct visualization of the colon is recommended. 2. Intramural fat within the anterior aspect of the bladder wall, may reflect sequela of prior infection or inflammation, correlate with patient's symptoms. 3. Aortic Atherosclerosis (ICD10-I70.0).  PUD Prophylaxis : None  DVT Prophylaxis  :  Lovenox   Lab Results  Component Value Date   PLT 260 08/29/2019    Inpatient Medications  Scheduled Meds: Continuous Infusions: . lactated ringers 75 mL/hr at 08/29/19 0937  . piperacillin-tazobactam (ZOSYN)  IV 3.375 g (08/29/19 0537)   PRN Meds:.acetaminophen **OR** acetaminophen, HYDROmorphone (DILAUDID) injection, ondansetron **OR** ondansetron (ZOFRAN) IV  Antibiotics  :    Anti-infectives (From admission, onward)   Start     Dose/Rate Route Frequency Ordered Stop   08/29/19 0600  piperacillin-tazobactam (ZOSYN) IVPB 3.375 g     3.375 g 12.5 mL/hr over  240 Minutes Intravenous Every 8 hours 08/28/19 1824     08/28/19 1715  piperacillin-tazobactam (ZOSYN) IVPB 3.375 g     3.375 g 100 mL/hr over 30 Minutes Intravenous  Once 08/28/19 1709 08/28/19 1805       Time Spent in minutes  30   Lala Lund M.D on 08/29/2019 at 10:49 AM  To page go to [email or web address] - password [facility]  [facility] -  Office  [phone]   See all Orders from today for further details    Objective:   Vitals:   08/28/19 1852 08/28/19 1900 08/28/19 2123 08/29/19 0445  BP: 131/71 115/73 132/75 130/75  Pulse: 69 70 68 73  Resp: 17 18 18 18   Temp:   98.5 F (36.9 C) 98.5 F (36.9 C)  TempSrc:   Oral Oral  SpO2: 98% 96% 95% 94%  Weight:   81.2 kg     Wt Readings from Last 3 Encounters:  08/28/19 81.2 kg  08/26/19 80.4 kg  05/02/19 82.8 kg     Intake/Output Summary (Last 24 hours) at 08/29/2019 1049 Last data filed at 08/29/2019 0537 Gross per 24 hour  Intake 709.66 ml  Output --  Net 709.66 ml     Physical Exam  Awake Alert, No new F.N deficits, Normal affect [place].AT,PERRAL Supple Neck,No JVD, No cervical lymphadenopathy appriciated.  Symmetrical Chest wall movement, Good air movement bilaterally, CTAB RRR,No Gallops,Rubs or new Murmurs, No Parasternal Heave +ve B.Sounds, Abd Soft, mild LLQ tenderness, No organomegaly appriciated, No rebound - guarding or rigidity. No Cyanosis, Clubbing or edema, No new Rash or bruise       Data Review:    CBC Recent Labs  Lab 08/28/19 1338 08/29/19 0505  WBC 6.9 5.8  HGB 14.5 13.7  HCT 43.0 40.7  PLT 278 260  MCV 84.5 85.0  MCH 28.5 28.6  MCHC 33.7 33.7  RDW 11.7 11.9    Chemistries  Recent Labs  Lab 08/28/19 1338 08/28/19 1816 08/29/19 0505  NA 139  --  140  K 3.3*  --  3.3*  CL 104  --  107  CO2 21*  --  22  GLUCOSE 135*  --  92  BUN 14  --  12  CREATININE 0.86  --  0.86  CALCIUM 8.9  --  8.4*  AST 16  --  20  ALT 13  --  13  ALKPHOS 66  --  58  BILITOT 0.8  --  0.8   MG  --  2.0  --      ------------------------------------------------------------------------------------------------------------------ No results for input(s): CHOL, HDL, LDLCALC, TRIG, CHOLHDL, LDLDIRECT in the last 72 hours.  No results found for: HGBA1C ------------------------------------------------------------------------------------------------------------------ No results for input(s): TSH, T4TOTAL, T3FREE, THYROIDAB in the last 72 hours.  Invalid input(s): FREET3  Cardiac Enzymes No results for input(s): CKMB, TROPONINI, MYOGLOBIN in the last 168 hours.  Invalid input(s): CK ------------------------------------------------------------------------------------------------------------------ No results found for: BNP  Micro Results Recent Results (from the past 240 hour(s))  SARS CORONAVIRUS 2 (TAT 6-24 HRS) Nasopharyngeal Nasopharyngeal Swab     Status: Abnormal   Collection Time: 08/28/19  5:35 PM   Specimen: Nasopharyngeal Swab  Result Value Ref Range Status   SARS Coronavirus 2 POSITIVE (A) NEGATIVE Final    Comment: RESULT CALLED TO, READ BACK BY AND VERIFIED WITH: RN KAY EDWARDS AT 0248 BY MESSAN HOUEGNIFIO ON 08/29/2019 (NOTE) SARS-CoV-2 target nucleic acids are DETECTED. The SARS-CoV-2 RNA is generally detectable in upper and lower respiratory specimens during the acute phase of infection. Positive results are indicative of the presence of SARS-CoV-2 RNA. Clinical correlation with patient history and other diagnostic information is  necessary to determine patient infection status. Positive results do not rule out bacterial infection or co-infection with other viruses.  The expected result is Negative. Fact Sheet for Patients: HairSlick.no Fact Sheet for Healthcare Providers: quierodirigir.com This test is not yet approved or cleared by the [place] FDA and  has been authorized for detection and/or  diagnosis of SARS-CoV-2 by FDA under an Emergency Use Authorization (EUA). This EUA will remain  in effect (meaning this test  can be used) for the duration of the COVID-19 declaration under Section 564(b)(1) of the Act, 21 U.S.C. section 360bbb-3(b)(1), unless the authorization is terminated or revoked sooner. Performed at [facility], [address], [place], [place] [address]     Radiology Reports CT ABDOMEN PELVIS W CONTRAST  Result Date: 08/28/2019 CLINICAL DATA:  Diverticulitis suspected EXAM: CT ABDOMEN AND PELVIS WITH CONTRAST TECHNIQUE: Multidetector CT imaging of the abdomen and pelvis was performed using the standard protocol following bolus administration of intravenous contrast. CONTRAST:  OMNIPAQUE IOHEXOL 300 MG/ML  SOLN COMPARISON:  Abdominal radiograph Sep 17, 2013 FINDINGS: Lower chest: Lung bases are clear. Normal heart size. No pericardial effusion. Hepatobiliary: No focal liver abnormality is seen. No gallstones, gallbladder wall thickening, or biliary dilatation. Pancreas: Partial fatty replacement of the pancreas. Spleen: Normal in size without focal abnormality. Small accessory splenule. Adrenals/Urinary Tract: Normal adrenal glands. Few subcentimeter hypoattenuating foci in the kidneys too small to fully characterize on CT imaging but statistically likely benign. Kidneys are otherwise unremarkable, without renal calculi, suspicious lesion, or hydronephrosis. Intramural fat noted within the anterior aspect of the bladder wall, may reflect sequela of prior infection or inflammation. Stomach/Bowel: Distal esophagus, stomach and duodenal sweep are unremarkable. No small bowel wall thickening or dilatation. No evidence of obstruction.  A normal appendix is visualized. Much of the proximal colon is fluid-filled. There are numerous distal colonic diverticula including a focally thickened segment of the mid to distal sigmoid arising near the rectosigmoid junction is an  inflamed rim enhancing outpouching which may reflect an inflamed colonic diverticula versus contained perforation/intramural abscess which does demonstrate significant surrounding phlegmonous change as well as loss of discernible fat plane with an adjacent loop of the more proximal sigmoid colon as well as the dorsal aspect of the uterus. No extraluminal gas is evident. No extraluminal free fluid is evident. Vascular/Lymphatic: Atherosclerotic plaque within the normal caliber aorta. Reactive adenopathy noted in the deep pelvis in the region of extensive pericolonic inflammation. Reproductive: The anteverted uterus. Loss of discernible fat plane of the posterior/dorsal uterine body and the adjacent inflammatory changes of the sigmoid. No other concerning adnexal abnormality. Other: No free fluid or free air. Extensive phlegmonous change in the deep pelvis, as detailed above. No bowel containing hernias. Musculoskeletal: Multilevel degenerative changes are present in the imaged portions of the spine. No acute osseous abnormality or suspicious osseous lesion. IMPRESSION: 1. Rim enhancing collection arising from the distal sigmoid just proximal to the rectosigmoid junction with extensive surrounding inflammatory/phlegmonous change. This may reflect a contained perforated diverticulum though the extent of inflammatory changes is quite pronounced and does result in reactive thickening and inflammation of a more proximal loop of sigmoid colon as well as loss of discernible fat planes with the posterior/dorsal aspect of the uterus. Developing colo-colonic fistulization is not fully excluded particularly in the absence of luminal contrast media. No extraluminal gas or free fluid is evident. Given this complicated appearance, surgical consultation may be warranted. Furthermore, after resolution of acute symptoms direct visualization of the colon is recommended. 2. Intramural fat within the anterior aspect of the bladder wall,  may reflect sequela of prior infection or inflammation, correlate with patient's symptoms. 3. Aortic Atherosclerosis (ICD10-I70.0). These results were called by telephone at the time of interpretation on 08/28/2019 at 4:56 pm to provider [facility] , who verbally acknowledged these results. Electronically Signed   By: Kreg Shropshire M.D.   On: 08/28/2019 16:57

## 2019-08-29 NOTE — Progress Notes (Signed)
Pharmacy Antibiotic Note  Katie Blake is a 69 y.o. female admitted on 08/28/2019 with COVID + diverticulitis.  Pharmacy has been consulted for Zosyn dosing.  ID: COVID+, diverticulitis, failed cipro/flagyl PTA. Afebrile. WBC WNL. CrCl 67 Zosyn 4/14>>  Plan: Con't Zosyn 3.375g IV q8hr. No further dosage adjustment required.  Pharmacy will sign off. Please reconsult for further dosing assitance.    Weight: 81.2 kg (179 lb 0.2 oz)  Temp (24hrs), Avg:98.5 F (36.9 C), Min:98.4 F (36.9 C), Max:98.5 F (36.9 C)  Recent Labs  Lab 08/28/19 1338 08/28/19 1815 08/28/19 2147 08/29/19 0505  WBC 6.9  --   --  5.8  CREATININE 0.86  --   --  0.86  LATICACIDVEN  --  1.2 1.1  --     Estimated Creatinine Clearance: 67.6 mL/min (by C-G formula based on SCr of 0.86 mg/dL).    Allergies  Allergen Reactions  . Sulfa Antibiotics     Krysti Hickling S. Merilynn Finland, PharmD, BCPS Clinical Staff Pharmacist [email or web address] Pasty Spillers 08/29/2019 9:25 AM

## 2019-08-29 NOTE — Plan of Care (Signed)

## 2019-08-29 NOTE — Progress Notes (Signed)
[facility] Progress Note     Subjective: Patient reports pain is improved from initial presentation, now more soreness in LLQ. Nausea improving. Had some diarrhea this AM. Denies SOB, chest pain, body aches.   ROS negative otherwise.   Objective: Vital signs in last 24 hours: Temp:  [98.4 F (36.9 C)-98.5 F (36.9 C)] 98.5 F (36.9 C) (04/15 0445) Pulse Rate:  [68-97] 73 (04/15 0445) Resp:  [17-20] 18 (04/15 0445) BP: (115-151)/(71-93) 130/75 (04/15 0445) SpO2:  [94 %-98 %] 94 % (04/15 0445) Weight:  [81.2 kg] 81.2 kg (04/14 2123) Last BM Date: 08/28/19  Intake/Output from previous day: 04/14 0701 - 04/15 0700 In: 709.7 [I.V.:659.7; IV Piggyback:50] Out: -  Intake/Output this shift: No intake/output data recorded.  PE: General: pleasant, WD, WN white female who is laying in bed in NAD HEENT: Sclera are noninjected.  PERRL.  Ears and nose without any masses or lesions.  Mouth is pink and moist Heart: regular, rate, and rhythm.  Normal s1,s2. No obvious murmurs, gallops, or rubs noted.  Palpable radial and pedal pulses bilaterally Lungs: CTAB, no wheezes, rhonchi, or rales noted.  Respiratory effort nonlabored Abd: soft, NT, ND, +BS, no masses, hernias, or organomegaly MS: all 4 extremities are symmetrical with no cyanosis, clubbing, or edema. Skin: warm and dry with no masses, lesions, or rashes Neuro: Cranial nerves 2-12 grossly intact, sensation grossly intact throughout Psych: A&Ox3 with an appropriate affect.   Lab Results:  Recent Labs    08/28/19 1338 08/29/19 0505  WBC 6.9 5.8  HGB 14.5 13.7  HCT 43.0 40.7  PLT 278 260   BMET Recent Labs    08/28/19 1338 08/29/19 0505  NA 139 140  K 3.3* 3.3*  CL 104 107  CO2 21* 22  GLUCOSE 135* 92  BUN 14 12  CREATININE 0.86 0.86  CALCIUM 8.9 8.4*   PT/INR No results for input(s): LABPROT, INR in the last 72 hours. CMP     Component Value Date/Time   NA 140 08/29/2019 0505   K 3.3 (L)  08/29/2019 0505   CL 107 08/29/2019 0505   CO2 22 08/29/2019 0505   GLUCOSE 92 08/29/2019 0505   BUN 12 08/29/2019 0505   CREATININE 0.86 08/29/2019 0505   CREATININE 0.77 09/17/2013 1723   CALCIUM 8.4 (L) 08/29/2019 0505   PROT 6.3 (L) 08/29/2019 0505   ALBUMIN 3.1 (L) 08/29/2019 0505   AST 20 08/29/2019 0505   ALT 13 08/29/2019 0505   ALKPHOS 58 08/29/2019 0505   BILITOT 0.8 08/29/2019 0505   GFRNONAA >60 08/29/2019 0505   GFRAA >60 08/29/2019 0505   Lipase     Component Value Date/Time   LIPASE 18 08/28/2019 1338       Studies/Results: CT ABDOMEN PELVIS W CONTRAST  Result Date: 08/28/2019 CLINICAL DATA:  Diverticulitis suspected EXAM: CT ABDOMEN AND PELVIS WITH CONTRAST TECHNIQUE: Multidetector CT imaging of the abdomen and pelvis was performed using the standard protocol following bolus administration of intravenous contrast. CONTRAST:  OMNIPAQUE IOHEXOL 300 MG/ML  SOLN COMPARISON:  Abdominal radiograph Sep 17, 2013 FINDINGS: Lower chest: Lung bases are clear. Normal heart size. No pericardial effusion. Hepatobiliary: No focal liver abnormality is seen. No gallstones, gallbladder wall thickening, or biliary dilatation. Pancreas: Partial fatty replacement of the pancreas. Spleen: Normal in size without focal abnormality. Small accessory splenule. Adrenals/Urinary Tract: Normal adrenal glands. Few subcentimeter hypoattenuating foci in the kidneys too small to fully characterize on CT imaging but statistically likely benign.  Kidneys are otherwise unremarkable, without renal calculi, suspicious lesion, or hydronephrosis. Intramural fat noted within the anterior aspect of the bladder wall, may reflect sequela of prior infection or inflammation. Stomach/Bowel: Distal esophagus, stomach and duodenal sweep are unremarkable. No small bowel wall thickening or dilatation. No evidence of obstruction. A normal appendix is visualized. Much of the proximal colon is fluid-filled. There are  numerous distal colonic diverticula including a focally thickened segment of the mid to distal sigmoid arising near the rectosigmoid junction is an inflamed rim enhancing outpouching which may reflect an inflamed colonic diverticula versus contained perforation/intramural abscess which does demonstrate significant surrounding phlegmonous change as well as loss of discernible fat plane with an adjacent loop of the more proximal sigmoid colon as well as the dorsal aspect of the uterus. No extraluminal gas is evident. No extraluminal free fluid is evident. Vascular/Lymphatic: Atherosclerotic plaque within the normal caliber aorta. Reactive adenopathy noted in the deep pelvis in the region of extensive pericolonic inflammation. Reproductive: The anteverted uterus. Loss of discernible fat plane of the posterior/dorsal uterine body and the adjacent inflammatory changes of the sigmoid. No other concerning adnexal abnormality. Other: No free fluid or free air. Extensive phlegmonous change in the deep pelvis, as detailed above. No bowel containing hernias. Musculoskeletal: Multilevel degenerative changes are present in the imaged portions of the spine. No acute osseous abnormality or suspicious osseous lesion. IMPRESSION: 1. Rim enhancing collection arising from the distal sigmoid just proximal to the rectosigmoid junction with extensive surrounding inflammatory/phlegmonous change. This may reflect a contained perforated diverticulum though the extent of inflammatory changes is quite pronounced and does result in reactive thickening and inflammation of a more proximal loop of sigmoid colon as well as loss of discernible fat planes with the posterior/dorsal aspect of the uterus. Developing colo-colonic fistulization is not fully excluded particularly in the absence of luminal contrast media. No extraluminal gas or free fluid is evident. Given this complicated appearance, surgical consultation may be warranted. Furthermore,  after resolution of acute symptoms direct visualization of the colon is recommended. 2. Intramural fat within the anterior aspect of the bladder wall, may reflect sequela of prior infection or inflammation, correlate with patient's symptoms. 3. Aortic Atherosclerosis (ICD10-I70.0). These results were called by telephone at the time of interpretation on 08/28/2019 at 4:56 pm to provider [facility] , who verbally acknowledged these results. Electronically Signed   By: Lovena Le M.D.   On: 08/28/2019 16:57    Anti-infectives: Anti-infectives (From admission, onward)   Start     Dose/Rate Route Frequency Ordered Stop   08/29/19 0600  piperacillin-tazobactam (ZOSYN) IVPB 3.375 g     3.375 g 12.5 mL/hr over 240 Minutes Intravenous Every 8 hours 08/28/19 1824     08/28/19 1715  piperacillin-tazobactam (ZOSYN) IVPB 3.375 g     3.375 g 100 mL/hr over 30 Minutes Intravenous  Once 08/28/19 1709 08/28/19 1805       Assessment/Plan Acute sigmoid diverticulitis with possible intraluminal abscess - pain improved and WBC normal, afebrile - ok to start CLD today - continue IV abx - recommend outpatient colonoscopy in 6-8 weeks - no indication for surgical intervention at this time  FEN: CLD, IVF VTE: SCDs, ok to have chemical prophylaxis from a surgery standpoint ID: Zosyn 4/14>>  LOS: 1 day    Norm Parcel , [facility] 08/29/2019, 8:43 AM Please see Amion for pager number during day hours 7:00am-4:30pm

## 2019-08-29 NOTE — Progress Notes (Signed)
Call from lab that pt has a positive covid test. [facility] notified, MD on call made aware. AC to call bed placement for transfer to another unit.

## 2019-08-29 NOTE — Progress Notes (Signed)
Report given to Katie Blake, pt transferred to 5w- 21. All pts belongings taken with pt.

## 2019-08-30 DIAGNOSIS — E876 Hypokalemia: Secondary | ICD-10-CM | POA: Diagnosis not present

## 2019-08-30 DIAGNOSIS — K5792 Diverticulitis of intestine, part unspecified, without perforation or abscess without bleeding: Secondary | ICD-10-CM | POA: Diagnosis not present

## 2019-08-30 DIAGNOSIS — U071 COVID-19: Secondary | ICD-10-CM | POA: Diagnosis not present

## 2019-08-30 LAB — COMPREHENSIVE METABOLIC PANEL
ALT: 11 U/L (ref 0–44)
AST: 15 U/L (ref 15–41)
Albumin: 2.8 g/dL — ABNORMAL LOW (ref 3.5–5.0)
Alkaline Phosphatase: 50 U/L (ref 38–126)
Anion gap: 11 (ref 5–15)
BUN: 6 mg/dL — ABNORMAL LOW (ref 8–23)
CO2: 23 mmol/L (ref 22–32)
Calcium: 8.3 mg/dL — ABNORMAL LOW (ref 8.9–10.3)
Chloride: 106 mmol/L (ref 98–111)
Creatinine, Ser: 0.81 mg/dL (ref 0.44–1.00)
GFR calc Af Amer: >60 mL/min (ref >60)
GFR calc non Af Amer: >60 mL/min (ref >60)
Glucose, Bld: 100 mg/dL — ABNORMAL HIGH (ref 70–99)
Potassium: 3.3 mmol/L — ABNORMAL LOW (ref 3.5–5.1)
Sodium: 140 mmol/L (ref 135–145)
Total Bilirubin: 0.7 mg/dL (ref 0.3–1.2)
Total Protein: 6 g/dL — ABNORMAL LOW (ref 6.5–8.1)

## 2019-08-30 LAB — CBC WITH DIFFERENTIAL/PLATELET
Abs Immature Granulocytes: 0.01 10*3/uL (ref 0.00–0.07)
Basophils Absolute: 0 10*3/uL (ref 0.0–0.1)
Basophils Relative: 1 %
Eosinophils Absolute: 0.1 10*3/uL (ref 0.0–0.5)
Eosinophils Relative: 2 %
HCT: 38.7 % (ref 36.0–46.0)
Hemoglobin: 13.3 g/dL (ref 12.0–15.0)
Immature Granulocytes: 0 %
Lymphocytes Relative: 39 %
Lymphs Abs: 2.2 10*3/uL (ref 0.7–4.0)
MCH: 29.6 pg (ref 26.0–34.0)
MCHC: 34.4 g/dL (ref 30.0–36.0)
MCV: 86 fL (ref 80.0–100.0)
Monocytes Absolute: 0.4 10*3/uL (ref 0.1–1.0)
Monocytes Relative: 8 %
Neutro Abs: 2.8 10*3/uL (ref 1.7–7.7)
Neutrophils Relative %: 50 %
Platelets: 272 10*3/uL (ref 150–400)
RBC: 4.5 MIL/uL (ref 3.87–5.11)
RDW: 11.9 % (ref 11.5–15.5)
WBC: 5.6 10*3/uL (ref 4.0–10.5)
nRBC: 0 % (ref 0.0–0.2)

## 2019-08-30 LAB — BRAIN NATRIURETIC PEPTIDE: B Natriuretic Peptide: 145.3 pg/mL — ABNORMAL HIGH (ref 0.0–100.0)

## 2019-08-30 LAB — D-DIMER, QUANTITATIVE: D-Dimer, Quant: 1.11 ug/mL-FEU — ABNORMAL HIGH (ref 0.00–0.50)

## 2019-08-30 LAB — C-REACTIVE PROTEIN: CRP: 5.5 mg/dL — ABNORMAL HIGH (ref <1.0)

## 2019-08-30 LAB — MAGNESIUM: Magnesium: 2 mg/dL (ref 1.7–2.4)

## 2019-08-30 MED ORDER — POTASSIUM CHLORIDE CRYS ER 20 MEQ PO TBCR
40.0000 meq | EXTENDED_RELEASE_TABLET | Freq: Once | ORAL | Status: AC
  Administered 2019-08-30: 40 meq via ORAL
  Filled 2019-08-30: qty 2

## 2019-08-30 MED ORDER — KCL-LACTATED RINGERS-D5W 20 MEQ/L IV SOLN
INTRAVENOUS | Status: DC
  Filled 2019-08-30 (×4): qty 1000

## 2019-08-30 MED ORDER — PNEUMOCOCCAL VAC POLYVALENT 25 MCG/0.5ML IJ INJ
0.5000 mL | INJECTION | INTRAMUSCULAR | Status: DC
  Filled 2019-08-30: qty 0.5

## 2019-08-30 NOTE — Progress Notes (Signed)
PROGRESS NOTE                                                                                                                                                                                                             Patient Demographics:    Katie Blake, is a 69 y.o. female, DOB - [birth year], HLK:[id number]  Outpatient Primary MD for the patient is Jinny Sanders, MD    LOS - 2  Admit date - 08/28/2019    Chief Complaint  Patient presents with  . Abdominal Pain  . Emesis  . Diarrhea       Brief Narrative  Katie Blake is a 69 y.o. female with medical history significant of recurrent diverticulitis presents to emergency department due to abdominal pain, vomiting and diarrhea.  Work-up was consistent with acute diverticulitis with possible abscess and perforation and incidental COVID-19 infection.   Subjective:   Patient in bed, appears comfortable, denies any headache, no fever, no chest pain or pressure, no shortness of breath , left lower quadrant abdominal pain has improved, improved diarrhea no nausea. No focal weakness.    Assessment  & Plan :     1.  Acute diverticulitis with possible small abscess and perforation.  She has had multiple similar episodes in the past, clinically appears stable and exam appears to be nonconcerning, general surgery following and being treated conservatively.  Continue bowel rest with antibiotics.  Gentle hydration and monitor.  2. Acute COVID-19 infection - incidental finding, monitor clinically along with inflammatory markers.  Encouraged the patient to sit up in chair in the daytime use I-S and flutter valve for pulmonary toiletry and then prone in bed when at night.  Will advance activity and titrate down oxygen as possible.    SpO2: 94 %  Recent Labs  Lab 08/28/19 1735 08/29/19 0505 08/30/19 0309  CRP  --  9.0* 5.5*  DDIMER  --  1.23* 1.11*  BNP  --   --  145.3*    SARSCOV2NAA POSITIVE*  --   --     3.  Hypokalemia.  Replaced will continue to monitor.    Condition - Fair  Family Communication  :  No Family  Code Status :  Full  Diet :   Diet Order  Diet full liquid Room service appropriate? Yes; Fluid consistency: Thin  Diet effective now               Disposition Plan  : Stay in the hospital for the treatment of acute diverticulitis with perforation  Consults  : General surgery  Procedures  :    CT abdomen pelvis.  1. Rim enhancing collection arising from the distal sigmoid just proximal to the rectosigmoid junction with extensive surrounding inflammatory/phlegmonous change. This may reflect a contained perforated diverticulum though the extent of inflammatory changes is quite pronounced and does result in reactive thickening and inflammation of a more proximal loop of sigmoid colon as well as loss of discernible fat planes with the posterior/dorsal aspect of the uterus. Developing colo-colonic fistulization is not fully excluded particularly in the absence of luminal contrast media. No extraluminal gas or free fluid is evident. Given this complicated appearance, surgical consultation may be warranted. Furthermore, after resolution of acute symptoms direct visualization of the colon is recommended. 2. Intramural fat within the anterior aspect of the bladder wall, may reflect sequela of prior infection or inflammation, correlate with patient's symptoms. 3. Aortic Atherosclerosis (ICD10-I70.0).  PUD Prophylaxis : None  DVT Prophylaxis  :  Lovenox   Lab Results  Component Value Date   PLT 272 08/30/2019    Inpatient Medications  Scheduled Meds: . enoxaparin (LOVENOX) injection  40 mg Subcutaneous Daily   Continuous Infusions: . lactated ringers 75 mL/hr at 08/30/19 0500  . piperacillin-tazobactam (ZOSYN)  IV 3.375 g (08/30/19 0534)   PRN Meds:.acetaminophen **OR** [DISCONTINUED] acetaminophen,  HYDROcodone-acetaminophen, [DISCONTINUED] ondansetron **OR** ondansetron (ZOFRAN) IV  Antibiotics  :    Anti-infectives (From admission, onward)   Start     Dose/Rate Route Frequency Ordered Stop   08/29/19 0600  piperacillin-tazobactam (ZOSYN) IVPB 3.375 g     3.375 g 12.5 mL/hr over 240 Minutes Intravenous Every 8 hours 08/28/19 1824     08/28/19 1715  piperacillin-tazobactam (ZOSYN) IVPB 3.375 g     3.375 g 100 mL/hr over 30 Minutes Intravenous  Once 08/28/19 1709 08/28/19 1805       Time Spent in minutes  30   Susa RaringPrashant Jontavia Leatherbury M.D on 08/30/2019 at 10:49 AM  To page go to [email or web address] - password [email or web address]  [facility] -  Office  [phone]   See all Orders from today for further details    Objective:   Vitals:   08/29/19 1242 08/29/19 2019 08/29/19 2051 08/30/19 0537  BP: 117/71  126/71 117/60  Pulse: 70  64 61  Resp: 19  18 18   Temp: 98 F (36.7 C)  99.2 F (37.3 C) 97.9 F (36.6 C)  TempSrc: Oral  Oral Oral  SpO2: 96%  97% 94%  Weight:  81 kg    Height:  5\' 6"  (1.676 m)      Wt Readings from Last 3 Encounters:  08/29/19 81 kg  08/26/19 80.4 kg  05/02/19 82.8 kg     Intake/Output Summary (Last 24 hours) at 08/30/2019 1049 Last data filed at 08/30/2019 0500 Gross per 24 hour  Intake 1749.41 ml  Output --  Net 1749.41 ml     Physical Exam  Awake Alert, No new F.N deficits, Normal affect [place].AT,PERRAL Supple Neck,No JVD, No cervical lymphadenopathy appriciated.  Symmetrical Chest wall movement, Good air movement bilaterally, CTAB RRR,No Gallops, Rubs or new Murmurs, No Parasternal Heave +ve B.Sounds, Abd Soft, mild LLQ tenderness, No organomegaly appriciated, No rebound - guarding or rigidity. No Cyanosis,  Clubbing or edema, No new Rash or bruise        Data Review:    CBC Recent Labs  Lab 08/28/19 1338 08/29/19 0505 08/30/19 0309  WBC 6.9 5.8 5.6  HGB 14.5 13.7 13.3  HCT 43.0 40.7 38.7  PLT 278 260 272  MCV 84.5 85.0 86.0  MCH  28.5 28.6 29.6  MCHC 33.7 33.7 34.4  RDW 11.7 11.9 11.9  LYMPHSABS  --   --  2.2  MONOABS  --   --  0.4  EOSABS  --   --  0.1  BASOSABS  --   --  0.0    Chemistries  Recent Labs  Lab 08/28/19 1338 08/28/19 1816 08/29/19 0505 08/30/19 0309  NA 139  --  140 140  K 3.3*  --  3.3* 3.3*  CL 104  --  107 106  CO2 21*  --  22 23  GLUCOSE 135*  --  92 100*  BUN 14  --  12 6*  CREATININE 0.86  --  0.86 0.81  CALCIUM 8.9  --  8.4* 8.3*  AST 16  --  20 15  ALT 13  --  13 11  ALKPHOS 66  --  58 50  BILITOT 0.8  --  0.8 0.7  MG  --  2.0  --  2.0     ------------------------------------------------------------------------------------------------------------------ No results for input(s): CHOL, HDL, LDLCALC, TRIG, CHOLHDL, LDLDIRECT in the last 72 hours.  No results found for: HGBA1C ------------------------------------------------------------------------------------------------------------------ No results for input(s): TSH, T4TOTAL, T3FREE, THYROIDAB in the last 72 hours.  Invalid input(s): FREET3  Cardiac Enzymes No results for input(s): CKMB, TROPONINI, MYOGLOBIN in the last 168 hours.  Invalid input(s): CK ------------------------------------------------------------------------------------------------------------------    Component Value Date/Time   BNP 145.3 (H) 08/30/2019 0309    Micro Results Recent Results (from the past 240 hour(s))  SARS CORONAVIRUS 2 (TAT 6-24 HRS) Nasopharyngeal Nasopharyngeal Swab     Status: Abnormal   Collection Time: 08/28/19  5:35 PM   Specimen: Nasopharyngeal Swab  Result Value Ref Range Status   SARS Coronavirus 2 POSITIVE (A) NEGATIVE Final    Comment: RESULT CALLED TO, READ BACK BY AND VERIFIED WITH: RN KAY EDWARDS AT 0248 BY MESSAN HOUEGNIFIO ON 08/29/2019 (NOTE) SARS-CoV-2 target nucleic acids are DETECTED. The SARS-CoV-2 RNA is generally detectable in upper and lower respiratory specimens during the acute phase of infection.  Positive results are indicative of the presence of SARS-CoV-2 RNA. Clinical correlation with patient history and other diagnostic information is  necessary to determine patient infection status. Positive results do not rule out bacterial infection or co-infection with other viruses.  The expected result is Negative. Fact Sheet for Patients: HairSlick.no Fact Sheet for Healthcare Providers: quierodirigir.com This test is not yet approved or cleared by the [place] FDA and  has been authorized for detection and/or diagnosis of SARS-CoV-2 by FDA under an Emergency Use Authorization (EUA). This EUA will remain  in effect (meaning this test  can be used) for the duration of the COVID-19 declaration under Section 564(b)(1) of the Act, 21 U.S.C. section 360bbb-3(b)(1), unless the authorization is terminated or revoked sooner. Performed at [facility], [address] [place], [place] [address]   Culture, blood (routine x 2)     Status: None (Preliminary result)   Collection Time: 08/28/19  6:20 PM   Specimen: BLOOD RIGHT ARM  Result Value Ref Range Status   Specimen Description BLOOD RIGHT ARM  Final  Special Requests   Final    BOTTLES DRAWN AEROBIC AND ANAEROBIC Blood Culture adequate volume   Culture   Final    NO GROWTH < 24 HOURS Performed at [facility], [address], [place], [place] [address]    Report Status PENDING  Incomplete  Culture, blood (routine x 2)     Status: None (Preliminary result)   Collection Time: 08/28/19  6:30 PM   Specimen: BLOOD LEFT ARM  Result Value Ref Range Status   Specimen Description BLOOD LEFT ARM  Final   Special Requests   Final    BOTTLES DRAWN AEROBIC AND ANAEROBIC Blood Culture adequate volume   Culture   Final    NO GROWTH < 24 HOURS Performed at [facility], [address], [place], [place] [address]    Report Status PENDING  Incomplete    Radiology  Reports CT ABDOMEN PELVIS W CONTRAST  Result Date: 08/28/2019 CLINICAL DATA:  Diverticulitis suspected EXAM: CT ABDOMEN AND PELVIS WITH CONTRAST TECHNIQUE: Multidetector CT imaging of the abdomen and pelvis was performed using the standard protocol following bolus administration of intravenous contrast. CONTRAST:  OMNIPAQUE IOHEXOL 300 MG/ML  SOLN COMPARISON:  Abdominal radiograph Sep 17, 2013 FINDINGS: Lower chest: Lung bases are clear. Normal heart size. No pericardial effusion. Hepatobiliary: No focal liver abnormality is seen. No gallstones, gallbladder wall thickening, or biliary dilatation. Pancreas: Partial fatty replacement of the pancreas. Spleen: Normal in size without focal abnormality. Small accessory splenule. Adrenals/Urinary Tract: Normal adrenal glands. Few subcentimeter hypoattenuating foci in the kidneys too small to fully characterize on CT imaging but statistically likely benign. Kidneys are otherwise unremarkable, without renal calculi, suspicious lesion, or hydronephrosis. Intramural fat noted within the anterior aspect of the bladder wall, may reflect sequela of prior infection or inflammation. Stomach/Bowel: Distal esophagus, stomach and duodenal sweep are unremarkable. No small bowel wall thickening or dilatation. No evidence of obstruction. A normal appendix is visualized. Much of the proximal colon is fluid-filled. There are numerous distal colonic diverticula including a focally thickened segment of the mid to distal sigmoid arising near the rectosigmoid junction is an inflamed rim enhancing outpouching which may reflect an inflamed colonic diverticula versus contained perforation/intramural abscess which does demonstrate significant surrounding phlegmonous change as well as loss of discernible fat plane with an adjacent loop of the more proximal sigmoid colon as well as the dorsal aspect of the uterus. No extraluminal gas is evident. No extraluminal free fluid is evident.  Vascular/Lymphatic: Atherosclerotic plaque within the normal caliber aorta. Reactive adenopathy noted in the deep pelvis in the region of extensive pericolonic inflammation. Reproductive: The anteverted uterus. Loss of discernible fat plane of the posterior/dorsal uterine body and the adjacent inflammatory changes of the sigmoid. No other concerning adnexal abnormality. Other: No free fluid or free air. Extensive phlegmonous change in the deep pelvis, as detailed above. No bowel containing hernias. Musculoskeletal: Multilevel degenerative changes are present in the imaged portions of the spine. No acute osseous abnormality or suspicious osseous lesion. IMPRESSION: 1. Rim enhancing collection arising from the distal sigmoid just proximal to the rectosigmoid junction with extensive surrounding inflammatory/phlegmonous change. This may reflect a contained perforated diverticulum though the extent of inflammatory changes is quite pronounced and does result in reactive thickening and inflammation of a more proximal loop of sigmoid colon as well as loss of discernible fat planes with the posterior/dorsal aspect of the uterus. Developing colo-colonic fistulization is not fully excluded particularly in the absence of  luminal contrast media. No extraluminal gas or free fluid is evident. Given this complicated appearance, surgical consultation may be warranted. Furthermore, after resolution of acute symptoms direct visualization of the colon is recommended. 2. Intramural fat within the anterior aspect of the bladder wall, may reflect sequela of prior infection or inflammation, correlate with patient's symptoms. 3. Aortic Atherosclerosis (ICD10-I70.0). These results were called by telephone at the time of interpretation on 08/28/2019 at 4:56 pm to provider [facility] , who verbally acknowledged these results. Electronically Signed   By: Kreg Shropshire M.D.   On: 08/28/2019 16:57

## 2019-08-30 NOTE — Progress Notes (Signed)
Subjective: Patient still with some pain today, states it may only be slightly improved.  Tolerated clear liquids with no increase in pain. Ambulating some.  Diarrhea is lessening.   ROS: See above, otherwise other systems negative  Objective: Vital signs in last 24 hours: Temp:  [97.9 F (36.6 C)-99.2 F (37.3 C)] 97.9 F (36.6 C) (04/16 0537) Pulse Rate:  [61-70] 61 (04/16 0537) Resp:  [18-19] 18 (04/16 0537) BP: (117-126)/(60-71) 117/60 (04/16 0537) SpO2:  [94 %-97 %] 94 % (04/16 0537) Weight:  [81 kg] 81 kg (04/15 2019) Last BM Date: 08/29/19  Intake/Output from previous day: 04/15 0701 - 04/16 0700 In: 1749.4 [P.O.:240; I.V.:1400.2; IV Piggyback:109.3] Out: -  Intake/Output this shift: No intake/output data recorded.  PE: Heart: regular Lungs: CTAB Abd: soft, tender throughout lower abdomen but greatest in suprapubic region, +BS, ND  Lab Results:  Recent Labs    08/29/19 0505 08/30/19 0309  WBC 5.8 5.6  HGB 13.7 13.3  HCT 40.7 38.7  PLT 260 272   BMET Recent Labs    08/29/19 0505 08/30/19 0309  NA 140 140  K 3.3* 3.3*  CL 107 106  CO2 22 23  GLUCOSE 92 100*  BUN 12 6*  CREATININE 0.86 0.81  CALCIUM 8.4* 8.3*   PT/INR No results for input(s): LABPROT, INR in the last 72 hours. CMP     Component Value Date/Time   NA 140 08/30/2019 0309   K 3.3 (L) 08/30/2019 0309   CL 106 08/30/2019 0309   CO2 23 08/30/2019 0309   GLUCOSE 100 (H) 08/30/2019 0309   BUN 6 (L) 08/30/2019 0309   CREATININE 0.81 08/30/2019 0309   CREATININE 0.77 09/17/2013 1723   CALCIUM 8.3 (L) 08/30/2019 0309   PROT 6.0 (L) 08/30/2019 0309   ALBUMIN 2.8 (L) 08/30/2019 0309   AST 15 08/30/2019 0309   ALT 11 08/30/2019 0309   ALKPHOS 50 08/30/2019 0309   BILITOT 0.7 08/30/2019 0309   GFRNONAA >60 08/30/2019 0309   GFRAA >60 08/30/2019 0309   Lipase     Component Value Date/Time   LIPASE 18 08/28/2019 1338       Studies/Results: CT ABDOMEN PELVIS W  CONTRAST  Result Date: 08/28/2019 CLINICAL DATA:  Diverticulitis suspected EXAM: CT ABDOMEN AND PELVIS WITH CONTRAST TECHNIQUE: Multidetector CT imaging of the abdomen and pelvis was performed using the standard protocol following bolus administration of intravenous contrast. CONTRAST:  176mL OMNIPAQUE IOHEXOL 300 MG/ML  SOLN COMPARISON:  Abdominal radiograph Sep 17, 2013 FINDINGS: Lower chest: Lung bases are clear. Normal heart size. No pericardial effusion. Hepatobiliary: No focal liver abnormality is seen. No gallstones, gallbladder wall thickening, or biliary dilatation. Pancreas: Partial fatty replacement of the pancreas. Spleen: Normal in size without focal abnormality. Small accessory splenule. Adrenals/Urinary Tract: Normal adrenal glands. Few subcentimeter hypoattenuating foci in the kidneys too small to fully characterize on CT imaging but statistically likely benign. Kidneys are otherwise unremarkable, without renal calculi, suspicious lesion, or hydronephrosis. Intramural fat noted within the anterior aspect of the bladder wall, may reflect sequela of prior infection or inflammation. Stomach/Bowel: Distal esophagus, stomach and duodenal sweep are unremarkable. No small bowel wall thickening or dilatation. No evidence of obstruction. A normal appendix is visualized. Much of the proximal colon is fluid-filled. There are numerous distal colonic diverticula including a focally thickened segment of the mid to distal sigmoid arising near the rectosigmoid junction is an inflamed rim enhancing outpouching which may reflect an inflamed colonic  diverticula versus contained perforation/intramural abscess which does demonstrate significant surrounding phlegmonous change as well as loss of discernible fat plane with an adjacent loop of the more proximal sigmoid colon as well as the dorsal aspect of the uterus. No extraluminal gas is evident. No extraluminal free fluid is evident. Vascular/Lymphatic: Atherosclerotic  plaque within the normal caliber aorta. Reactive adenopathy noted in the deep pelvis in the region of extensive pericolonic inflammation. Reproductive: The anteverted uterus. Loss of discernible fat plane of the posterior/dorsal uterine body and the adjacent inflammatory changes of the sigmoid. No other concerning adnexal abnormality. Other: No free fluid or free air. Extensive phlegmonous change in the deep pelvis, as detailed above. No bowel containing hernias. Musculoskeletal: Multilevel degenerative changes are present in the imaged portions of the spine. No acute osseous abnormality or suspicious osseous lesion. IMPRESSION: 1. Rim enhancing collection arising from the distal sigmoid just proximal to the rectosigmoid junction with extensive surrounding inflammatory/phlegmonous change. This may reflect a contained perforated diverticulum though the extent of inflammatory changes is quite pronounced and does result in reactive thickening and inflammation of a more proximal loop of sigmoid colon as well as loss of discernible fat planes with the posterior/dorsal aspect of the uterus. Developing colo-colonic fistulization is not fully excluded particularly in the absence of luminal contrast media. No extraluminal gas or free fluid is evident. Given this complicated appearance, surgical consultation may be warranted. Furthermore, after resolution of acute symptoms direct visualization of the colon is recommended. 2. Intramural fat within the anterior aspect of the bladder wall, may reflect sequela of prior infection or inflammation, correlate with patient's symptoms. 3. Aortic Atherosclerosis (ICD10-I70.0). These results were called by telephone at the time of interpretation on 08/28/2019 at 4:56 pm to provider [facility] , who verbally acknowledged these results. Electronically Signed   By: Kreg Shropshire M.D.   On: 08/28/2019 16:57    Anti-infectives: Anti-infectives (From admission, onward)   Start     Dose/Rate  Route Frequency Ordered Stop   08/29/19 0600  piperacillin-tazobactam (ZOSYN) IVPB 3.375 g     3.375 g 12.5 mL/hr over 240 Minutes Intravenous Every 8 hours 08/28/19 1824     08/28/19 1715  piperacillin-tazobactam (ZOSYN) IVPB 3.375 g     3.375 g 100 mL/hr over 30 Minutes Intravenous  Once 08/28/19 1709 08/28/19 1805       Assessment/Plan COVID  Acute sigmoid diverticulitis with possible intraluminal abscess - pain stable but diarrhea improving.  Will try full liquids today - continue IV abx - recommend outpatient colonoscopy in 6-8 weeks - no indication for surgical intervention at this time  FEN: FLD, IVF VTE: SCDs, loenox ID: [id number] 4/14>>   LOS: 2 days    Letha Cape , [facility] 08/30/2019, 8:49 AM Please see Amion for pager number during day hours 7:00am-4:30pm or 7:00am -11:30am on weekends

## 2019-08-30 NOTE — Evaluation (Signed)
Physical Therapy Evaluation Patient Details Name: Katie Blake MRN: [id number] DOB: 20-Jan-1951 Today's Date: 08/30/2019   History of Present Illness  69 year old female admitted 08/28/19 with lower abdominal pain and nausea. Patient with recurrent diverticulitis, not responsive to outpatient therapy. General surgery consulted. CT showed focal sigmoid diverticulitis question of intraluminal abscess, not large enough or organized enough for drainage. Recommendation for medical management and outpatient colonoscopy in 6-8 weeks. No indication for surgical intervention at this time.    Clinical Impression  Patient presents near baseline level of mobility. Recommend she ambulate daily with nursing staff/tech. No skilled PT services indicated.    Follow Up Recommendations No PT follow up    Equipment Recommendations  None recommended by PT       Precautions / Restrictions Precautions Precautions: Other (comment) Precaution Comments: clear liquid diet at time of eval Restrictions Weight Bearing Restrictions: No      Mobility  Bed Mobility Overal bed mobility: Modified Independent  General bed mobility comments: bedrail, HOB approx 20 degrees  Transfers Overall transfer level: Independent Equipment used: None   Ambulation/Gait Ambulation/Gait assistance: Modified independent (Device/Increase time);Independent Gait Distance (Feet): 100 Feet Assistive device: None Gait Pattern/deviations: Step-through pattern Gait velocity: WFL   General Gait Details: O2 saturation 98%, HR 80 bpm durig gait trial.  Stairs Not assessed   Balance Overall balance assessment: No apparent balance deficits (not formally assessed)     Pertinent Vitals/Pain Pain Assessment: 0-10 Pain Score: 5  Pain Location: abdomen Pain Descriptors / Indicators: Dull Pain Intervention(s): Monitored during session;Limited activity within patient's tolerance    Home Living Family/patient expects to be discharged  to:: Private residence Living Arrangements: Children((dtr))   Type of Home: House Home Access: Stairs to enter Entrance Stairs-Rails: None Entrance Stairs-Number of Steps: 1 Home Layout: One level Home Equipment: None Additional Comments: Daughter works from home.     Prior Function Level of Independence: Independent    Comments: +drives, indepedent mobility, ADLs, IADLs     Hand Dominance   Dominant Hand: Right    Extremity/Trunk Assessment     Lower Extremity Assessment Lower Extremity Assessment: Overall WFL for tasks assessed RLE Deficits / Details: BLEs grossly 4/5 LLE Deficits / Details: BLEs grossly 4/5       Communication   Communication: No difficulties  Cognition Arousal/Alertness: Awake/alert Behavior During Therapy: WFL for tasks assessed/performed Overall Cognitive Status: Within Functional Limits for tasks assessed            Assessment/Plan    PT Assessment Patent does not need any further PT services         PT Goals (Current goals can be found in the Care Plan section)  Acute Rehab PT Goals PT Goal Formulation: With patient     AM-PAC PT "6 Clicks" Mobility  Outcome Measure Help needed turning from your back to your side while in a flat bed without using bedrails?: None Help needed moving from lying on your back to sitting on the side of a flat bed without using bedrails?: None Help needed moving to and from a bed to a chair (including a wheelchair)?: None Help needed standing up from a chair using your arms (e.g., wheelchair or bedside chair)?: None Help needed to walk in hospital room?: None Help needed climbing 3-5 steps with a railing? : A Little 6 Click Score: 23    End of Session   Activity Tolerance: Patient tolerated treatment well Patient left: in chair;with call bell/phone within reach Nurse  Communication: Mobility status      Time: 0820-0838 PT Time Calculation (min) (ACUTE ONLY): 18 min   Charges:   PT  Evaluation $PT Eval Low Complexity: 1 Low          Angelene Giovanni, PT, DPT Acute Rehab [phone] office    Angelene Giovanni 08/30/2019, 8:45 AM

## 2019-08-30 NOTE — Plan of Care (Signed)

## 2019-08-31 DIAGNOSIS — U071 COVID-19: Secondary | ICD-10-CM | POA: Diagnosis not present

## 2019-08-31 DIAGNOSIS — K5792 Diverticulitis of intestine, part unspecified, without perforation or abscess without bleeding: Secondary | ICD-10-CM | POA: Diagnosis not present

## 2019-08-31 DIAGNOSIS — E876 Hypokalemia: Secondary | ICD-10-CM | POA: Diagnosis not present

## 2019-08-31 LAB — COMPREHENSIVE METABOLIC PANEL
ALT: 10 U/L (ref 0–44)
AST: 12 U/L — ABNORMAL LOW (ref 15–41)
Albumin: 2.9 g/dL — ABNORMAL LOW (ref 3.5–5.0)
Alkaline Phosphatase: 48 U/L (ref 38–126)
Anion gap: 10 (ref 5–15)
BUN: <5 mg/dL — ABNORMAL LOW (ref 8–23)
CO2: 25 mmol/L (ref 22–32)
Calcium: 8.8 mg/dL — ABNORMAL LOW (ref 8.9–10.3)
Chloride: 107 mmol/L (ref 98–111)
Creatinine, Ser: 0.84 mg/dL (ref 0.44–1.00)
GFR calc Af Amer: >60 mL/min (ref >60)
GFR calc non Af Amer: >60 mL/min (ref >60)
Glucose, Bld: 110 mg/dL — ABNORMAL HIGH (ref 70–99)
Potassium: 3.6 mmol/L (ref 3.5–5.1)
Sodium: 142 mmol/L (ref 135–145)
Total Bilirubin: 0.5 mg/dL (ref 0.3–1.2)
Total Protein: 6 g/dL — ABNORMAL LOW (ref 6.5–8.1)

## 2019-08-31 LAB — CBC WITH DIFFERENTIAL/PLATELET
Abs Immature Granulocytes: 0.01 10*3/uL (ref 0.00–0.07)
Basophils Absolute: 0 10*3/uL (ref 0.0–0.1)
Basophils Relative: 1 %
Eosinophils Absolute: 0.1 10*3/uL (ref 0.0–0.5)
Eosinophils Relative: 2 %
HCT: 40 % (ref 36.0–46.0)
Hemoglobin: 13.5 g/dL (ref 12.0–15.0)
Immature Granulocytes: 0 %
Lymphocytes Relative: 37 %
Lymphs Abs: 1.9 10*3/uL (ref 0.7–4.0)
MCH: 28.1 pg (ref 26.0–34.0)
MCHC: 33.8 g/dL (ref 30.0–36.0)
MCV: 83.3 fL (ref 80.0–100.0)
Monocytes Absolute: 0.5 10*3/uL (ref 0.1–1.0)
Monocytes Relative: 9 %
Neutro Abs: 2.7 10*3/uL (ref 1.7–7.7)
Neutrophils Relative %: 51 %
Platelets: 305 10*3/uL (ref 150–400)
RBC: 4.8 MIL/uL (ref 3.87–5.11)
RDW: 11.7 % (ref 11.5–15.5)
WBC: 5.3 10*3/uL (ref 4.0–10.5)
nRBC: 0 % (ref 0.0–0.2)

## 2019-08-31 LAB — D-DIMER, QUANTITATIVE: D-Dimer, Quant: 1.38 ug/mL-FEU — ABNORMAL HIGH (ref 0.00–0.50)

## 2019-08-31 LAB — C-REACTIVE PROTEIN: CRP: 3.6 mg/dL — ABNORMAL HIGH (ref <1.0)

## 2019-08-31 LAB — BRAIN NATRIURETIC PEPTIDE: B Natriuretic Peptide: 240.6 pg/mL — ABNORMAL HIGH (ref 0.0–100.0)

## 2019-08-31 LAB — MAGNESIUM: Magnesium: 2 mg/dL (ref 1.7–2.4)

## 2019-08-31 MED ORDER — AMOXICILLIN-POT CLAVULANATE 875-125 MG PO TABS
1.0000 | ORAL_TABLET | Freq: Two times a day (BID) | ORAL | Status: DC
  Administered 2019-09-01: 1 via ORAL
  Filled 2019-08-31 (×2): qty 1

## 2019-08-31 NOTE — Plan of Care (Signed)
  Problem: Clinical Measurements: Goal: Diagnostic test results will improve Outcome: Progressing   Problem: Clinical Measurements: Goal: Respiratory complications will improve Outcome: Progressing   Problem: Nutrition: Goal: Adequate nutrition will be maintained Outcome: Progressing   

## 2019-08-31 NOTE — Progress Notes (Signed)
PROGRESS NOTE                                                                                                                                                                                                             Patient Demographics:    Katie Blake, is a 69 y.o. female, DOB - 1951-01-16, DSK:[id number]  Outpatient Primary MD for the patient is Excell Seltzer, MD    LOS - 3  Admit date - 08/28/2019    Chief Complaint  Patient presents with  . Abdominal Pain  . Emesis  . Diarrhea       Brief Narrative  Katie Blake is a 69 y.o. female with medical history significant of recurrent diverticulitis presents to emergency department due to abdominal pain, vomiting and diarrhea.  Work-up was consistent with acute diverticulitis with possible abscess and perforation and incidental COVID-19 infection.   Subjective:   Patient in bed, appears comfortable, denies any headache, no fever, no chest pain or pressure, no shortness of breath , improved abdominal pain. No focal weakness.   Assessment  & Plan :     1.  Acute diverticulitis with possible small abscess and perforation.  She has had multiple similar episodes in the past, clinically appears stable and exam appears to be nonconcerning, general surgery following and being treated conservatively.  Continue bowel rest with antibiotics.  Gentle hydration and monitor.   2. Acute COVID-19 infection - incidental finding, monitor clinically along with inflammatory markers.  Encouraged the patient to sit up in chair in the daytime use I-S and flutter valve for pulmonary toiletry and then prone in bed when at night.  Will advance activity and titrate down oxygen as possible.    SpO2: 95 %  Recent Labs  Lab 08/28/19 1735 08/29/19 0505 08/30/19 0309 08/31/19 0834  CRP  --  9.0* 5.5* 3.6*  DDIMER  --  1.23* 1.11* 1.38*  BNP  --   --  145.3* 240.6*  SARSCOV2NAA POSITIVE*  --    --   --     3.  Hypokalemia.  Replaced will continue to monitor.    Condition - Fair  Family Communication  :  No Family  Code Status :  Full  Diet :   Diet Order            DIET  SOFT Room service appropriate? Yes; Fluid consistency: Thin  Diet effective now               Disposition Plan  : Stay in the hospital for the treatment of acute diverticulitis with perforation  Consults  : General surgery  Procedures  :    CT abdomen pelvis.  1. Rim enhancing collection arising from the distal sigmoid just proximal to the rectosigmoid junction with extensive surrounding inflammatory/phlegmonous change. This may reflect a contained perforated diverticulum though the extent of inflammatory changes is quite pronounced and does result in reactive thickening and inflammation of a more proximal loop of sigmoid colon as well as loss of discernible fat planes with the posterior/dorsal aspect of the uterus. Developing colo-colonic fistulization is not fully excluded particularly in the absence of luminal contrast media. No extraluminal gas or free fluid is evident. Given this complicated appearance, surgical consultation may be warranted. Furthermore, after resolution of acute symptoms direct visualization of the colon is recommended. 2. Intramural fat within the anterior aspect of the bladder wall, may reflect sequela of prior infection or inflammation, correlate with patient's symptoms. 3. Aortic Atherosclerosis (ICD10-I70.0).  PUD Prophylaxis : None  DVT Prophylaxis  :  Lovenox   Lab Results  Component Value Date   PLT 305 08/31/2019    Inpatient Medications  Scheduled Meds: . amoxicillin-clavulanate  1 tablet Oral Q12H  . enoxaparin (LOVENOX) injection  40 mg Subcutaneous Daily  . pneumococcal 23 valent vaccine  0.5 mL Intramuscular Tomorrow-1000   Continuous Infusions: . dextrose 5% lactated ringers with KCl 20 mEq/L 50 mL/hr at 08/31/19 0615  . lactated ringers 75 mL/hr at  08/30/19 0500   PRN Meds:.acetaminophen **OR** [DISCONTINUED] acetaminophen, HYDROcodone-acetaminophen, [DISCONTINUED] ondansetron **OR** ondansetron (ZOFRAN) IV  Antibiotics  :    Anti-infectives (From admission, onward)   Start     Dose/Rate Route Frequency Ordered Stop   08/31/19 1000  amoxicillin-clavulanate (AUGMENTIN) 875-125 MG per tablet 1 tablet     1 tablet Oral Every 12 hours 08/31/19 0909     08/29/19 0600  piperacillin-tazobactam (ZOSYN) IVPB 3.375 g  Status:  Discontinued     3.375 g 12.5 mL/hr over 240 Minutes Intravenous Every 8 hours 08/28/19 1824 08/31/19 0909   08/28/19 1715  piperacillin-tazobactam (ZOSYN) IVPB 3.375 g     3.375 g 100 mL/hr over 30 Minutes Intravenous  Once 08/28/19 1709 08/28/19 1805       Time Spent in minutes  30   Lala Lund M.D on 08/31/2019 at 12:10 PM  To page go to [email or web address] - password [facility]  [facility] -  Office  [phone]   See all Orders from today for further details    Objective:   Vitals:   08/30/19 0537 08/30/19 1454 08/30/19 2122 08/31/19 0504  BP: 117/60 138/65 (!) 151/85 138/76  Pulse: 61 (!) 58 68 65  Resp: 18 18 19 18   Temp: 97.9 F (36.6 C) 98.5 F (36.9 C) 98.5 F (36.9 C) 98.4 F (36.9 C)  TempSrc: Oral Oral Oral Oral  SpO2: 94% 96% 96% 95%  Weight:      Height:        Wt Readings from Last 3 Encounters:  08/29/19 81 kg  08/26/19 80.4 kg  05/02/19 82.8 kg     Intake/Output Summary (Last 24 hours) at 08/31/2019 1210 Last data filed at 08/31/2019 0615 Gross per 24 hour  Intake 1406.01 ml  Output --  Net 1406.01 ml     Physical Exam  Awake Alert, No new F.N deficits, Normal affect [place].AT,PERRAL Supple Neck,No JVD, No cervical lymphadenopathy appriciated.  Symmetrical Chest wall movement, Good air movement bilaterally, CTAB RRR,No Gallops, Rubs or new Murmurs, No Parasternal Heave +ve B.Sounds, Abd Soft, Mild LLQ tenderness, No organomegaly appriciated, No rebound - guarding  or rigidity. No Cyanosis, Clubbing or edema, No new Rash or bruise    Data Review:    CBC Recent Labs  Lab 08/28/19 1338 08/29/19 0505 08/30/19 0309 08/31/19 0834  WBC 6.9 5.8 5.6 5.3  HGB 14.5 13.7 13.3 13.5  HCT 43.0 40.7 38.7 40.0  PLT 278 260 272 305  MCV 84.5 85.0 86.0 83.3  MCH 28.5 28.6 29.6 28.1  MCHC 33.7 33.7 34.4 33.8  RDW 11.7 11.9 11.9 11.7  LYMPHSABS  --   --  2.2 1.9  MONOABS  --   --  0.4 0.5  EOSABS  --   --  0.1 0.1  BASOSABS  --   --  0.0 0.0    Chemistries  Recent Labs  Lab 08/28/19 1338 08/28/19 1816 08/29/19 0505 08/30/19 0309 08/31/19 0834  NA 139  --  140 140 142  K 3.3*  --  3.3* 3.3* 3.6  CL 104  --  107 106 107  CO2 21*  --  22 23 25   GLUCOSE 135*  --  92 100* 110*  BUN 14  --  12 6* <5*  CREATININE 0.86  --  0.86 0.81 0.84  CALCIUM 8.9  --  8.4* 8.3* 8.8*  AST 16  --  20 15 12*  ALT 13  --  13 11 10   ALKPHOS 66  --  58 50 48  BILITOT 0.8  --  0.8 0.7 0.5  MG  --  2.0  --  2.0 2.0     ------------------------------------------------------------------------------------------------------------------ No results for input(s): CHOL, HDL, LDLCALC, TRIG, CHOLHDL, LDLDIRECT in the last 72 hours.  No results found for: HGBA1C ------------------------------------------------------------------------------------------------------------------ No results for input(s): TSH, T4TOTAL, T3FREE, THYROIDAB in the last 72 hours.  Invalid input(s): FREET3  Cardiac Enzymes No results for input(s): CKMB, TROPONINI, MYOGLOBIN in the last 168 hours.  Invalid input(s): CK ------------------------------------------------------------------------------------------------------------------    Component Value Date/Time   BNP 240.6 (H) 08/31/2019    Micro Results  Recent Results (from the past 240 hour(s))  SARS CORONAVIRUS 2 (TAT 6-24 HRS) Nasopharyngeal Nasopharyngeal Swab     Status: Abnormal   Collection Time: 08/28/19  5:35 PM   Specimen:  Nasopharyngeal Swab  Result Value Ref Range Status   SARS Coronavirus 2 POSITIVE (A) NEGATIVE Final    Comment: RESULT CALLED TO, READ BACK BY AND VERIFIED WITH: RN KAY EDWARDS AT 0248 BY MESSAN HOUEGNIFIO ON 08/29/2019 (NOTE) SARS-CoV-2 target nucleic acids are DETECTED. The SARS-CoV-2 RNA is generally detectable in upper and lower respiratory specimens during the acute phase of infection. Positive results are indicative of the presence of SARS-CoV-2 RNA. Clinical correlation with patient history and other diagnostic information is  necessary to determine patient infection status. Positive results do not rule out bacterial infection or co-infection with other viruses.  The expected result is Negative. Fact Sheet for Patients: 08/30/19 Fact Sheet for Healthcare Providers: 08/31/2019 This test is not yet approved or cleared by the [place] FDA and  has been authorized for detection and/or diagnosis of SARS-CoV-2 by FDA under an Emergency Use Authorization (EUA). This EUA will remain  in effect (meaning this test  can be used) for the duration of the  COVID-19 declaration under Section 564(b)(1) of the Act, 21 U.S.C. section 360bbb-3(b)(1), unless the authorization is terminated or revoked sooner. Performed at [facility], [address], [place], [place] [address]   Culture, blood (routine x 2)     Status: None (Preliminary result)   Collection Time: 08/28/19  6:20 PM   Specimen: BLOOD RIGHT ARM  Result Value Ref Range Status   Specimen Description BLOOD RIGHT ARM  Final   Special Requests   Final    BOTTLES DRAWN AEROBIC AND ANAEROBIC Blood Culture adequate volume   Culture   Final    NO GROWTH 3 DAYS Performed at [facility], [address] [place], [place] [address]    Report Status PENDING  Incomplete  Culture, blood (routine x 2)     Status: None (Preliminary result)   Collection Time:  08/28/19  6:30 PM   Specimen: BLOOD LEFT ARM  Result Value Ref Range Status   Specimen Description BLOOD LEFT ARM  Final   Special Requests   Final    BOTTLES DRAWN AEROBIC AND ANAEROBIC Blood Culture adequate volume   Culture   Final    NO GROWTH 3 DAYS Performed at [facility], [address] [place], [place] [address]    Report Status PENDING  Incomplete    Radiology Reports  CT ABDOMEN PELVIS W CONTRAST  Result Date: 08/28/2019 CLINICAL DATA:  Diverticulitis suspected EXAM: CT ABDOMEN AND PELVIS WITH CONTRAST TECHNIQUE: Multidetector CT imaging of the abdomen and pelvis was performed using the standard protocol following bolus administration of intravenous contrast. CONTRAST:  OMNIPAQUE IOHEXOL 300 MG/ML  SOLN COMPARISON:  Abdominal radiograph Sep 17, 2013 FINDINGS: Lower chest: Lung bases are clear. Normal heart size. No pericardial effusion. Hepatobiliary: No focal liver abnormality is seen. No gallstones, gallbladder wall thickening, or biliary dilatation. Pancreas: Partial fatty replacement of the pancreas. Spleen: Normal in size without focal abnormality. Small accessory splenule. Adrenals/Urinary Tract: Normal adrenal glands. Few subcentimeter hypoattenuating foci in the kidneys too small to fully characterize on CT imaging but statistically likely benign. Kidneys are otherwise unremarkable, without renal calculi, suspicious lesion, or hydronephrosis. Intramural fat noted within the anterior aspect of the bladder wall, may reflect sequela of prior infection or inflammation. Stomach/Bowel: Distal esophagus, stomach and duodenal sweep are unremarkable. No small bowel wall thickening or dilatation. No evidence of obstruction. A normal appendix is visualized. Much of the proximal colon is fluid-filled. There are numerous distal colonic diverticula including a focally thickened segment of the mid to distal sigmoid arising near the rectosigmoid junction is an inflamed rim enhancing  outpouching which may reflect an inflamed colonic diverticula versus contained perforation/intramural abscess which does demonstrate significant surrounding phlegmonous change as well as loss of discernible fat plane with an adjacent loop of the more proximal sigmoid colon as well as the dorsal aspect of the uterus. No extraluminal gas is evident. No extraluminal free fluid is evident. Vascular/Lymphatic: Atherosclerotic plaque within the normal caliber aorta. Reactive adenopathy noted in the deep pelvis in the region of extensive pericolonic inflammation. Reproductive: The anteverted uterus. Loss of discernible fat plane of the posterior/dorsal uterine body and the adjacent inflammatory changes of the sigmoid. No other concerning adnexal abnormality. Other: No free fluid or free air. Extensive phlegmonous change in the deep pelvis, as detailed above. No bowel containing hernias. Musculoskeletal: Multilevel degenerative changes are present in the imaged portions of the spine. No acute osseous abnormality or suspicious osseous lesion. IMPRESSION: 1.  Rim enhancing collection arising from the distal sigmoid just proximal to the rectosigmoid junction with extensive surrounding inflammatory/phlegmonous change. This may reflect a contained perforated diverticulum though the extent of inflammatory changes is quite pronounced and does result in reactive thickening and inflammation of a more proximal loop of sigmoid colon as well as loss of discernible fat planes with the posterior/dorsal aspect of the uterus. Developing colo-colonic fistulization is not fully excluded particularly in the absence of luminal contrast media. No extraluminal gas or free fluid is evident. Given this complicated appearance, surgical consultation may be warranted. Furthermore, after resolution of acute symptoms direct visualization of the colon is recommended. 2. Intramural fat within the anterior aspect of the bladder wall, may reflect sequela of  prior infection or inflammation, correlate with patient's symptoms. 3. Aortic Atherosclerosis (ICD10-I70.0). These results were called by telephone at the time of interpretation on 08/28/2019 at 4:56 pm to provider [facility] , who verbally acknowledged these results. Electronically Signed   By: Kreg ShropshirePrice  DeHay M.D.   On: 08/28/2019 16:57

## 2019-08-31 NOTE — Progress Notes (Signed)
Patient ID: Katie Blake, female   DOB: [birth year], 69 y.o.   MRN: [id number]       Subjective: Patient states she is feeling better today.  Still with some pain but does not require pain medication for this.  Tolerating full liquids with no increase in pain or other issues.  Diarrhea continues to improve.  Patient doubtful that she really has covid as she has no symptoms and is wanting to have a new test.  I told her she would need to discuss this with the primary team.  ROS: See above, otherwise other systems negative  Objective: Vital signs in last 24 hours: Temp:  [98.4 F (36.9 C)-98.5 F (36.9 C)] 98.4 F (36.9 C) (04/17 0504) Pulse Rate:  [58-68] 65 (04/17 0504) Resp:  [18-19] 18 (04/17 0504) BP: (138-151)/(65-85) 138/76 (04/17 0504) SpO2:  [95 %-96 %] 95 % (04/17 0504) Last BM Date: 08/30/19  Intake/Output from previous day: 04/16 0701 - 04/17 0700 In: 1406 [I.V.:1211.8; IV Piggyback:194.3] Out: -  Intake/Output this shift: No intake/output data recorded.  PE: Gen: NAD Heart: regular Lungs: CTAB Abd: soft, mildly tender across her lower abdomen, but improved, +BS, ND  Lab Results:  Recent Labs    08/29/19 0505 08/30/19 0309  WBC 5.8 5.6  HGB 13.7 13.3  HCT 40.7 38.7  PLT 260 272   BMET Recent Labs    08/29/19 0505 08/30/19 0309  NA 140 140  K 3.3* 3.3*  CL 107 106  CO2 22 23  GLUCOSE 92 100*  BUN 12 6*  CREATININE 0.86 0.81  CALCIUM 8.4* 8.3*   PT/INR No results for input(s): LABPROT, INR in the last 72 hours. CMP     Component Value Date/Time   NA 140 08/30/2019 0309   K 3.3 (L) 08/30/2019 0309   CL 106 08/30/2019 0309   CO2 23 08/30/2019 0309   GLUCOSE 100 (H) 08/30/2019 0309   BUN 6 (L) 08/30/2019 0309   CREATININE 0.81 08/30/2019 0309   CREATININE 0.77 09/17/2013 1723   CALCIUM 8.3 (L) 08/30/2019 0309   PROT 6.0 (L) 08/30/2019 0309   ALBUMIN 2.8 (L) 08/30/2019 0309   AST 15 08/30/2019 0309   ALT 11 08/30/2019 0309   ALKPHOS 50  08/30/2019 0309   BILITOT 0.7 08/30/2019 0309   GFRNONAA >60 08/30/2019 0309   GFRAA >60 08/30/2019 0309   Lipase     Component Value Date/Time   LIPASE 18 08/28/2019 1338       Studies/Results: No results found.  Anti-infectives: Anti-infectives (From admission, onward)   Start     Dose/Rate Route Frequency Ordered Stop   08/31/19 1000  amoxicillin-clavulanate (AUGMENTIN) 875-125 MG per tablet 1 tablet     1 tablet Oral Every 12 hours 08/31/19 0909     08/29/19 0600  piperacillin-tazobactam (ZOSYN) IVPB 3.375 g  Status:  Discontinued     3.375 g 12.5 mL/hr over 240 Minutes Intravenous Every 8 hours 08/28/19 1824 08/31/19 0909   08/28/19 1715  piperacillin-tazobactam (ZOSYN) IVPB 3.375 g     3.375 g 100 mL/hr over 30 Minutes Intravenous  Once 08/28/19 1709 08/28/19 1805       Assessment/Plan COVID  Acute sigmoid diverticulitis with possible intraluminal abscess - adv to soft diet -transition to oral abx therapy -likely home tomorrow with probably a total of 14 days of abx therapy given the possibility of an intraluminal abscess - recommend outpatient colonoscopy in 6-8 weeks - no indication for surgical intervention at this time  FEN: soft diet VTE: SCDs, loenox ID: Zosyn 4/14>>4/17, Augmentin 4/17 -->   LOS: 3 days    Letha Cape , [facility] 08/31/2019, 9:10 AM Please see Amion for pager number during day hours 7:00am-4:30pm or 7:00am -11:30am on weekends

## 2019-09-01 DIAGNOSIS — U071 COVID-19: Secondary | ICD-10-CM | POA: Diagnosis not present

## 2019-09-01 DIAGNOSIS — K5792 Diverticulitis of intestine, part unspecified, without perforation or abscess without bleeding: Secondary | ICD-10-CM | POA: Diagnosis not present

## 2019-09-01 LAB — CBC WITH DIFFERENTIAL/PLATELET
Abs Immature Granulocytes: 0.01 10*3/uL (ref 0.00–0.07)
Basophils Absolute: 0 10*3/uL (ref 0.0–0.1)
Basophils Relative: 1 %
Eosinophils Absolute: 0.1 10*3/uL (ref 0.0–0.5)
Eosinophils Relative: 2 %
HCT: 40.2 % (ref 36.0–46.0)
Hemoglobin: 13.7 g/dL (ref 12.0–15.0)
Immature Granulocytes: 0 %
Lymphocytes Relative: 44 %
Lymphs Abs: 2.6 10*3/uL (ref 0.7–4.0)
MCH: 29 pg (ref 26.0–34.0)
MCHC: 34.1 g/dL (ref 30.0–36.0)
MCV: 85.2 fL (ref 80.0–100.0)
Monocytes Absolute: 0.4 10*3/uL (ref 0.1–1.0)
Monocytes Relative: 7 %
Neutro Abs: 2.6 10*3/uL (ref 1.7–7.7)
Neutrophils Relative %: 46 %
Platelets: 338 10*3/uL (ref 150–400)
RBC: 4.72 MIL/uL (ref 3.87–5.11)
RDW: 11.8 % (ref 11.5–15.5)
WBC: 5.8 10*3/uL (ref 4.0–10.5)
nRBC: 0 % (ref 0.0–0.2)

## 2019-09-01 LAB — COMPREHENSIVE METABOLIC PANEL
ALT: 13 U/L (ref 0–44)
AST: 16 U/L (ref 15–41)
Albumin: 3 g/dL — ABNORMAL LOW (ref 3.5–5.0)
Alkaline Phosphatase: 48 U/L (ref 38–126)
Anion gap: 10 (ref 5–15)
BUN: <5 mg/dL — ABNORMAL LOW (ref 8–23)
CO2: 26 mmol/L (ref 22–32)
Calcium: 8.7 mg/dL — ABNORMAL LOW (ref 8.9–10.3)
Chloride: 106 mmol/L (ref 98–111)
Creatinine, Ser: 0.78 mg/dL (ref 0.44–1.00)
GFR calc Af Amer: >60 mL/min (ref >60)
GFR calc non Af Amer: >60 mL/min (ref >60)
Glucose, Bld: 103 mg/dL — ABNORMAL HIGH (ref 70–99)
Potassium: 3.6 mmol/L (ref 3.5–5.1)
Sodium: 142 mmol/L (ref 135–145)
Total Bilirubin: 0.4 mg/dL (ref 0.3–1.2)
Total Protein: 6.4 g/dL — ABNORMAL LOW (ref 6.5–8.1)

## 2019-09-01 LAB — BRAIN NATRIURETIC PEPTIDE: B Natriuretic Peptide: 105.3 pg/mL — ABNORMAL HIGH (ref 0.0–100.0)

## 2019-09-01 LAB — D-DIMER, QUANTITATIVE: D-Dimer, Quant: 0.9 ug/mL-FEU — ABNORMAL HIGH (ref 0.00–0.50)

## 2019-09-01 LAB — C-REACTIVE PROTEIN: CRP: 2.9 mg/dL — ABNORMAL HIGH (ref <1.0)

## 2019-09-01 LAB — MAGNESIUM: Magnesium: 1.9 mg/dL (ref 1.7–2.4)

## 2019-09-01 LAB — SARS CORONAVIRUS 2 (TAT 6-24 HRS): SARS Coronavirus 2: POSITIVE — AB

## 2019-09-01 MED ORDER — AMOXICILLIN-POT CLAVULANATE 875-125 MG PO TABS: 1.0000 | ORAL_TABLET | Freq: Two times a day (BID) | ORAL | 0 refills | Status: DC

## 2019-09-01 MED ORDER — DOCUSATE SODIUM 100 MG PO CAPS: 100.0000 mg | ORAL_CAPSULE | Freq: Every day | ORAL | 0 refills | Status: DC

## 2019-09-01 NOTE — Discharge Instructions (Signed)
Follow with Primary MD Excell Seltzer, MD in 7 days   Get CBC, CMP  checked next visit within 1 week by Primary MD   Activity: As tolerated with Full fall precautions use walker/cane & assistance as needed  Disposition Home    Diet: Soft diet for 1 week then advance gradually to low fiber regular consistency diet as tolerated.  See below for details.  Special Instructions: If you have smoked or chewed Tobacco  in the last 2 yrs please stop smoking, stop any regular Alcohol  and or any Recreational drug use.  On your next visit with your primary care physician please Get Medicines reviewed and adjusted.  Please request your Prim.MD to go over all Hospital Tests and Procedure/Radiological results at the follow up, please get all Hospital records sent to your Prim MD by signing hospital release before you go home.  If you experience worsening of your admission symptoms, develop shortness of breath, life threatening emergency, suicidal or homicidal thoughts you must seek medical attention immediately by calling 911 or calling your MD immediately  if symptoms less severe.  You Must read complete instructions/literature along with all the possible adverse reactions/side effects for all the Medicines you take and that have been prescribed to you. Take any new Medicines after you have completely understood and accpet all the possible adverse reactions/side effects.      Person Under Monitoring Name: Katie Blake  Location: [address] [place] [address]   Infection Prevention Recommendations for Individuals Confirmed to have, or Being Evaluated for, 2019 Novel Coronavirus (COVID-19) Infection Who Receive Care at Home  Individuals who are confirmed to have, or are being evaluated for, COVID-19 should follow the prevention steps below until a healthcare provider or local or state health department says they can return to normal activities.  Stay home except to get medical  care You should restrict activities outside your home, except for getting medical care. Do not go to work, school, or public areas, and do not use public transportation or taxis.  Call ahead before visiting your doctor Before your medical appointment, call the healthcare provider and tell them that you have, or are being evaluated for, COVID-19 infection. This will help the healthcare provider's office take steps to keep other people from getting infected. Ask your healthcare provider to call the local or state health department.  Monitor your symptoms Seek prompt medical attention if your illness is worsening (e.g., difficulty breathing). Before going to your medical appointment, call the healthcare provider and tell them that you have, or are being evaluated for, COVID-19 infection. Ask your healthcare provider to call the local or state health department.  Wear a facemask You should wear a facemask that covers your nose and mouth when you are in the same room with other people and when you visit a healthcare provider. People who live with or visit you should also wear a facemask while they are in the same room with you.  Separate yourself from other people in your home As much as possible, you should stay in a different room from other people in your home. Also, you should use a separate bathroom, if available.  Avoid sharing household items You should not share dishes, drinking glasses, cups, eating utensils, towels, bedding, or other items with other people in your home. After using these items, you should wash them thoroughly with soap and water.  Cover your coughs and sneezes Cover your mouth and nose with a  tissue when you cough or sneeze, or you can cough or sneeze into your sleeve. Throw used tissues in a lined trash can, and immediately wash your hands with soap and water for at least 20 seconds or use an alcohol-based hand rub.  Wash your [facility] your hands often and  thoroughly with soap and water for at least 20 seconds. You can use an alcohol-based hand sanitizer if soap and water are not available and if your hands are not visibly dirty. Avoid touching your eyes, nose, and mouth with unwashed hands.   Prevention Steps for Caregivers and Household Members of Individuals Confirmed to have, or Being Evaluated for, COVID-19 Infection Being Cared for in the Home  If you live with, or provide care at home for, a person confirmed to have, or being evaluated for, COVID-19 infection please follow these guidelines to prevent infection:  Follow healthcare provider's instructions Make sure that you understand and can help the patient follow any healthcare provider instructions for all care.  Provide for the patient's basic needs You should help the patient with basic needs in the home and provide support for getting groceries, prescriptions, and other personal needs.  Monitor the patient's symptoms If they are getting sicker, call his or her medical provider and tell them that the patient has, or is being evaluated for, COVID-19 infection. This will help the healthcare provider's office take steps to keep other people from getting infected. Ask the healthcare provider to call the local or state health department.  Limit the number of people who have contact with the patient  If possible, have only one caregiver for the patient.  Other household members should stay in another home or place of residence. If this is not possible, they should stay  in another room, or be separated from the patient as much as possible. Use a separate bathroom, if available.  Restrict visitors who do not have an essential need to be in the home.  Keep older adults, very young children, and other sick people away from the patient Keep older adults, very young children, and those who have compromised immune systems or chronic health conditions away from the patient. This  includes people with chronic heart, lung, or kidney conditions, diabetes, and cancer.  Ensure good ventilation Make sure that shared spaces in the home have good air flow, such as from an air conditioner or an opened window, weather permitting.  Wash your hands often  Wash your hands often and thoroughly with soap and water for at least 20 seconds. You can use an alcohol based hand sanitizer if soap and water are not available and if your hands are not visibly dirty.  Avoid touching your eyes, nose, and mouth with unwashed hands.  Use disposable paper towels to dry your hands. If not available, use dedicated cloth towels and replace them when they become wet.  Wear a facemask and gloves  Wear a disposable facemask at all times in the room and gloves when you touch or have contact with the patient's blood, body fluids, and/or secretions or excretions, such as sweat, saliva, sputum, nasal mucus, vomit, urine, or feces.  Ensure the mask fits over your nose and mouth tightly, and do not touch it during use.  Throw out disposable facemasks and gloves after using them. Do not reuse.  Wash your hands immediately after removing your facemask and gloves.  If your personal clothing becomes contaminated, carefully remove clothing and launder. Wash your hands after handling contaminated  clothing.  Place all used disposable facemasks, gloves, and other waste in a lined container before disposing them with other household waste.  Remove gloves and wash your hands immediately after handling these items.  Do not share dishes, glasses, or other household items with the patient  Avoid sharing household items. You should not share dishes, drinking glasses, cups, eating utensils, towels, bedding, or other items with a patient who is confirmed to have, or being evaluated for, COVID-19 infection.  After the person uses these items, you should wash them thoroughly with soap and water.  Wash laundry  thoroughly  Immediately remove and wash clothes or bedding that have blood, body fluids, and/or secretions or excretions, such as sweat, saliva, sputum, nasal mucus, vomit, urine, or feces, on them.  Wear gloves when handling laundry from the patient.  Read and follow directions on labels of laundry or clothing items and detergent. In general, wash and dry with the warmest temperatures recommended on the label.  Clean all areas the individual has used often  Clean all touchable surfaces, such as counters, tabletops, doorknobs, bathroom fixtures, toilets, phones, keyboards, tablets, and bedside tables, every day. Also, clean any surfaces that may have blood, body fluids, and/or secretions or excretions on them.  Wear gloves when cleaning surfaces the patient has come in contact with.  Use a diluted bleach solution (e.g., dilute bleach with 1 part bleach and 10 parts water) or a household disinfectant with a label that says EPA-registered for coronaviruses. To make a bleach solution at home, add 1 tablespoon of bleach to 1 quart (4 cups) of water. For a larger supply, add  cup of bleach to 1 gallon (16 cups) of water.  Read labels of cleaning products and follow recommendations provided on product labels. Labels contain instructions for safe and effective use of the cleaning product including precautions you should take when applying the product, such as wearing gloves or eye protection and making sure you have good ventilation during use of the product.  Remove gloves and wash hands immediately after cleaning.  Monitor yourself for signs and symptoms of illness Caregivers and household members are considered close contacts, should monitor their health, and will be asked to limit movement outside of the home to the extent possible. Follow the monitoring steps for close contacts listed on the symptom monitoring form.   ? If you have additional questions, contact your local health department  or call the epidemiologist on call at [phone] (available 24/7). ? This guidance is subject to change. For the most up-to-date guidance from CDC, please refer to their website: TripMetro.hu    Low-Fiber Eating Plan, for 4-6 weeks then switch to high fiber diet eating plan Fiber is found in fruits, vegetables, whole grains, and beans. Eating a diet low in fiber helps to reduce how often you have bowel movements and how much you produce during a bowel movement. A low-fiber eating plan may help your digestive system heal if:  You have certain conditions, such as Crohn's disease or diverticulitis.  You recently had radiation therapy on your pelvis or bowel.  You recently had intestinal surgery.  You have a new surgical opening in your abdomen (colostomy or ileostomy).  Your intestine is narrowed (stricture). Your health care provider will determine how long you need to stay on this diet. Your health care provider may recommend that you work with a diet and nutrition specialist (dietitian). What are tips for following this plan? General guidelines  Follow recommendations from your  dietitian about how much fiber you should have each day.  Most people on this eating plan should try to eat less than 10 grams (g) of fiber each day. Your daily fiber goal is _________________ g.  Take vitamin and mineral supplements as told by your health care provider or dietitian. Chewable or liquid forms are best when on this eating plan. Reading food labels  Check food labels for the amount of dietary fiber.  Choose foods that have less than 2 grams of fiber in one serving. Cooking  Use white flour and other allowed grains for baking and cooking.  Cook meat using methods that keep it tender, such as braising or poaching.  Cook eggs until the yolk is completely solid.  Cook with healthy oils, such as olive oil or canola oil. Meal  planning   Eat 5-6 small meals throughout the day instead of 3 large meals.  If you are lactose intolerant: ? Choose low-lactose dairy foods. ? Do not eat dairy foods, if told by your dietitian.  Limit fat and oils to less than 8 teaspoons a day.  Eat small portions of desserts. What foods are allowed? The items listed below may not be a complete list. Talk with your dietitian about what dietary choices are best for you. Grains All bread and crackers made with white flour. Waffles, pancakes, and [place] toast. Bagels. Pretzels. Melba toast, zwieback, and matzoh. Cooked and dried cereals that do not contain whole grains, added fiber, seeds, or dried fruit. CornmealDenzil Magn[place]on. [place]. Hot and cold cereals made with refined corn, wheat, rice, or oats. Plain pasta and noodles. White rice. Vegetables Well-cooked or canned vegetables without skin, seeds, or stems. Cooked potatoes without skins. Vegetable juice. Fruits Soft-cooked or canned fruits without skin and seeds. Peeled ripe banana. Applesauce. Fruit juice without pulp. Meats and other protein foods Ground meat. Tender cuts of meat or poultry. Eggs. Fish, seafood, and shellfish. Smooth nut butters. Tofu. Dairy All milk products and drinks. Lactose-free milks, including rice, soy, and almond milks. Yogurt without fruit, nuts, chocolate, or granola mix-ins. Sour cream. Cottage cheese. Cheese. Beverages Decaf coffee. Fruit and vegetable juices or smoothies (in small amounts, with no pulp or skins, and with fruits from allowed list). Sports drinks. Herbal tea. Fats and oils Olive oil, canola oil, sunflower oil, flaxseed oil, and grapeseed oil. Mayonnaise. Cream cheese. Margarine. Butter. Sweets and desserts Plain cakes and cookies. Cream pies and pies made with allowed fruits. Pudding. Custard. Fruit gelatin. Sherbet. Popsicles. Ice cream without nuts. Plain hard candy. Honey. Jelly. Molasses. Syrups, including chocolate syrup. Chocolate.  Marshmallows. Gumdrops. Seasoning and other foods Bouillon. Broth. Cream soups made from allowed foods. Strained soup. Casseroles made with allowed foods. Ketchup. Mild mustard. Mild salad dressings. Plain gravies. Vinegar. Spices in moderation. Salt. Sugar. What foods are not allowed? The items listed below may not be a complete list. Talk with your dietitian about what dietary choices are best for you. Grains Whole wheat and whole grain breads and crackers. Multigrain breads and crackers. Rye bread. Whole grain or multigrain cereals. Cereals with nuts, raisins, or coconut. Bran. Coarse wheat cereals. Granola. High-fiber cereals. Cornmeal or corn bread. Whole grain pasta. Wild or brown rice. Quinoa. Popcorn. Buckwheat. Wheat germ. Vegetables Potato skins. Raw or undercooked vegetables. All beans and bean sprouts. Cooked greens. Corn. Peas. Cabbage. Beets. Broccoli. Brussels sprouts. Cauliflower. Mushrooms. Onions. Peppers. Parsnips. Okra. Sauerkraut. Fruit Raw or dried fruit. Berries. Fruit juice with pulp. Prune juice. Meats and other protein foods Tough,  fibrous meats with gristle. Fatty meat. Poultry with skin. Fried meat, Environmental education officer, or fish. Deli or lunch meats. Sausage, bacon, and hot dogs. Nuts and chunky nut butter. Dried peas, beans, and lentils. Dairy Yogurt with fruit, nuts, chocolate, or granola mix-ins. Beverages Caffeinated coffee and teas. Fats and oils Avocado. Coconut. Sweets and desserts Desserts, cookies, or candies that contain nuts or coconut. Dried fruit. Jams and preserves with seeds. Marmalade. Any dessert made with fruits or grains that are not allowed. Seasoning and other foods Corn tortilla chips. Soups made with vegetables or grains that are not allowed. Relish. Horseradish. Rosita Fire. Olives. Summary  Most people on a low-fiber eating plan should eat less than 10 grams of fiber a day. Follow recommendations from your dietitian about how much fiber you should have  each day.  Always check food labels to see the dietary fiber content of packaged foods. In general, a low-fiber food will have fewer than 2 grams of fiber per serving.  In general, try to avoid whole grains, raw fruits and vegetables, dried fruit, tough cuts of meat, nuts, and seeds.  Take a vitamin and mineral supplement as told by your health care provider or dietitian. This information is not intended to replace advice given to you by your health care provider. Make sure you discuss any questions you have with your health care provider. Document Revised: 08/24/2018 Document Reviewed: 07/05/2016 Elsevier Patient Education  2020 [employer] Inc.   High-Fiber Diet Fiber, also called dietary fiber, is a type of carbohydrate that is found in fruits, vegetables, whole grains, and beans. A high-fiber diet can have many health benefits. Your health care provider may recommend a high-fiber diet to help:  Prevent constipation. Fiber can make your bowel movements more regular.  Lower your cholesterol.  Relieve the following conditions: ? Swelling of veins in the anus (hemorrhoids). ? Swelling and irritation (inflammation) of specific areas of the digestive tract (uncomplicated diverticulosis). ? A problem of the large intestine (colon) that sometimes causes pain and diarrhea (irritable bowel syndrome, IBS).  Prevent overeating as part of a weight-loss plan.  Prevent heart disease, type 2 diabetes, and certain cancers. What is my plan? The recommended daily fiber intake in grams (g) includes:  38 g for men age 19 or younger.  30 g for men over age 2.  25 g for women age 14 or younger.  21 g for women over age 38. You can get the recommended daily intake of dietary fiber by:  Eating a variety of fruits, vegetables, grains, and beans.  Taking a fiber supplement, if it is not possible to get enough fiber through your diet. What do I need to know about a high-fiber diet?  It is better  to get fiber through food sources rather than from fiber supplements. There is not a lot of research about how effective supplements are.  Always check the fiber content on the nutrition facts label of any prepackaged food. Look for foods that contain 5 g of fiber or more per serving.  Talk with a diet and nutrition specialist (dietitian) if you have questions about specific foods that are recommended or not recommended for your medical condition, especially if those foods are not listed below.  Gradually increase how much fiber you consume. If you increase your intake of dietary fiber too quickly, you may have bloating, cramping, or gas.  Drink plenty of water. Water helps you to digest fiber. What are tips for following this plan?  Eat a wide  variety of high-fiber foods.  Make sure that half of the grains that you eat each day are whole grains.  Eat breads and cereals that are made with whole-grain flour instead of refined flour or white flour.  Eat brown rice, bulgur wheat, or millet instead of white rice.  Start the day with a breakfast that is high in fiber, such as a cereal that contains 5 g of fiber or more per serving.  Use beans in place of meat in soups, salads, and pasta dishes.  Eat high-fiber snacks, such as berries, raw vegetables, nuts, and popcorn.  Choose whole fruits and vegetables instead of processed forms like juice or sauce. What foods can I eat?  Fruits Berries. Pears. Apples. Oranges. Avocado. Prunes and raisins. Dried figs. Vegetables Sweet potatoes. Spinach. Kale. Artichokes. Cabbage. Broccoli. Cauliflower. Green peas. Carrots. Squash. Grains Whole-grain breads. Multigrain cereal. Oats and oatmeal. Brown rice. Barley. Bulgur wheat. Millet. Quinoa. Bran muffins. Popcorn. Rye wafer crackers. Meats and other proteins Navy, kidney, and pinto beans. Soybeans. Split peas. Lentils. Nuts and seeds. Dairy Fiber-fortified yogurt. Beverages Fiber-fortified soy  milk. Fiber-fortified orange juice. Other foods Fiber bars. The items listed above may not be a complete list of recommended foods and beverages. Contact a dietitian for more options. What foods are not recommended? Fruits Fruit juice. Cooked, strained fruit. Vegetables Fried potatoes. Canned vegetables. Well-cooked vegetables. Grains White bread. Pasta made with refined flour. White rice. Meats and other proteins Fatty cuts of meat. Fried chicken or fried fish. Dairy Milk. Yogurt. Cream cheese. Sour cream. Fats and oils Butters. Beverages Soft drinks. Other foods Cakes and pastries. The items listed above may not be a complete list of foods and beverages to avoid. Contact a dietitian for more information. Summary  Fiber is a type of carbohydrate. It is found in fruits, vegetables, whole grains, and beans.  There are many health benefits of eating a high-fiber diet, such as preventing constipation, lowering blood cholesterol, helping with weight loss, and reducing your risk of heart disease, diabetes, and certain cancers.  Gradually increase your intake of fiber. Increasing too fast can result in cramping, bloating, and gas. Drink plenty of water while you increase your fiber.  The best sources of fiber include whole fruits and vegetables, whole grains, nuts, seeds, and beans. This information is not intended to replace advice given to you by your health care provider. Make sure you discuss any questions you have with your health care provider. Document Revised: 03/06/2017 Document Reviewed: 03/06/2017 Elsevier Patient Education  2020 [employer].  Diverticulitis  Diverticulitis is when small pockets in your large intestine (colon) get infected or swollen. This causes stomach pain and watery poop (diarrhea). These pouches are called diverticula. They form in people who have a condition called diverticulosis. Follow these instructions at home: Medicines  Take  over-the-counter and prescription medicines only as told by your doctor. These include: ? Antibiotics. ? Pain medicines. ? Fiber pills. ? Probiotics. ? Stool softeners.  Do not drive or use heavy machinery while taking prescription pain medicine.  If you were prescribed an antibiotic, take it as told. Do not stop taking it even if you feel better. General instructions   Follow a diet as told by your doctor.  When you feel better, your doctor may tell you to change your diet. You may need to eat a lot of fiber. Fiber makes it easier to poop (have bowel movements). Healthy foods with fiber include: ? Berries. ? Beans. ? Lentils. ?  Green vegetables.  Exercise 3 or more times a week. Aim for 30 minutes each time. Exercise enough to sweat and make your heart beat faster.  Keep all follow-up visits as told. This is important. You may need to have an exam of the large intestine. This is called a colonoscopy. Contact a doctor if:  Your pain does not get better.  You have a hard time eating or drinking.  You are not pooping like normal. Get help right away if:  Your pain gets worse.  Your problems do not get better.  Your problems get worse very fast.  You have a fever.  You throw up (vomit) more than one time.  You have poop that is: ? Bloody. ? Black. ? Tarry. Summary  Diverticulitis is when small pockets in your large intestine (colon) get infected or swollen.  Take medicines only as told by your doctor.  Follow a diet as told by your doctor. This information is not intended to replace advice given to you by your health care provider. Make sure you discuss any questions you have with your health care provider. Document Revised: 04/14/2017 Document Reviewed: 05/19/2016 Elsevier Patient Education  [place].

## 2019-09-01 NOTE — Discharge Summary (Signed)
[name]:[id number] DOB: 1950-10-29 DOA: 08/28/2019  PCP: Jinny Sanders, MD  Admit date: 08/28/2019  Discharge date: 09/01/2019  Admitted From: Home  Disposition:  Home   Recommendations for Outpatient Follow-up:   Follow up with PCP in 1-2 weeks  PCP Please obtain BMP/CBC, 2 view CXR in 1week,  (see Discharge instructions)   PCP Please follow up on the following pending results:    Home Health: None   Equipment/Devices: None  Consultations: None  Discharge Condition: Stable    CODE STATUS: Full    Diet Recommendation: Soft  Diet Order            DIET SOFT Room service appropriate? Yes; Fluid consistency: Thin  Diet effective now               Chief Complaint  Patient presents with  . Abdominal Pain  . Emesis  . Diarrhea     Brief history of present illness from the day of admission and additional interim summary    Katie Blake a 69 y.o.femalewith medical history significant ofrecurrent diverticulitis presents to emergency department due to abdominal pain, vomiting and diarrhea.  Work-up was consistent with acute diverticulitis with possible abscess and perforation and incidental COVID-19 infection.                                                                 Hospital Course   1.  Acute diverticulitis with possible small abscess and perforation.  She has had multiple similar episodes in the past, clinically appears stable and exam appears to be nonconcerning, seen by general surgery and treated conservatively with IV antibiotics and bowel rest, now completely symptom-free and tolerating soft diet, case discussed with general surgery, she will be discharged home on 10 more days of oral Augmentin and soft diet with outpatient PCP and general surgery follow-up, she is completely  symptom-free at this time.   2. Acute COVID-19 infection - incidental finding, clinically, patient requested a repeat test prior to discharge which was ordered as she thought the first could have been false positive.  Please follow the final results.     SpO2: 95 %  Recent Labs  Lab 08/28/19 1735 08/29/19 0505 08/30/19 0309 08/31/19 0834 09/01/19 0440  CRP  --  9.0* 5.5* 3.6* 2.9*  DDIMER  --  1.23* 1.11* 1.38* 0.90*  BNP  --   --  145.3* 240.6* 105.3*  SARSCOV2NAA POSITIVE*  --   --   --   --     Hepatic Function Latest Ref Rng & Units 09/01/2019 08/31/2019 08/30/2019  Total Protein 6.5 - 8.1 g/dL 6.4(L) 6.0(L) 6.0(L)  Albumin 3.5 - 5.0 g/dL 3.0(L) 2.9(L) 2.8(L)  AST 15 - 41 U/L 16 12(L) 15  ALT 0 - 44 U/L 13 10  11  Alk Phosphatase 38 - 126 U/L 48 48 50  Total Bilirubin 0.3 - 1.2 mg/dL 0.4 0.5 0.7        Discharge diagnosis     Active Problems:   Acute diverticulitis   Hypokalemia   COVID-19 virus infection    Discharge instructions    Discharge Instructions    Discharge instructions   Complete by: As directed    Follow with Primary MD Diona Browner Amy E, MD in 7 days   Get CBC, CMP  checked next visit within 1 week by Primary MD   Activity: As tolerated with Full fall precautions use walker/cane & assistance as needed  Disposition Home    Diet: Soft diet for 1 week then advance gradually to low fiber regular consistency diet as tolerated.  See below for details.  Special Instructions: If you have smoked or chewed Tobacco  in the last 2 yrs please stop smoking, stop any regular Alcohol  and or any Recreational drug use.  On your next visit with your primary care physician please Get Medicines reviewed and adjusted.  Please request your Prim.MD to go over all Hospital Tests and Procedure/Radiological results at the follow up, please get all Hospital records sent to your Prim MD by signing hospital release before you go home.  If you experience worsening of  your admission symptoms, develop shortness of breath, life threatening emergency, suicidal or homicidal thoughts you must seek medical attention immediately by calling 911 or calling your MD immediately  if symptoms less severe.  You Must read complete instructions/literature along with all the possible adverse reactions/side effects for all the Medicines you take and that have been prescribed to you. Take any new Medicines after you have completely understood and accpet all the possible adverse reactions/side effects.   Increase activity slowly   Complete by: As directed    MyChart COVID-19 home monitoring program   Complete by: Sep 01, 2019    Is the patient willing to use the [place] for home monitoring?: Yes   Temperature monitoring   Complete by: Sep 01, 2019    After how many days would you like to receive a notification of this patient's flowsheet entries?: 1      Discharge Medications   Allergies as of 09/01/2019      Reactions   Sulfa Antibiotics       Medication List    STOP taking these medications   ciprofloxacin 750 MG tablet Commonly known as: CIPRO   metroNIDAZOLE 500 MG tablet Commonly known as: FLAGYL     TAKE these medications   amoxicillin-clavulanate 875-125 MG tablet Commonly known as: AUGMENTIN Take 1 tablet by mouth every 12 (twelve) hours.   dimenhyDRINATE 50 MG tablet Commonly known as: DRAMAMINE Take 50 mg by mouth every 8 (eight) hours as needed for nausea or dizziness.   docusate sodium 100 MG capsule Commonly known as: Colace Take 1 capsule (100 mg total) by mouth daily.   TYLENOL 500 MG tablet Generic drug: acetaminophen Take 1,000 mg by mouth daily as needed (for pain).       Follow-up Information    [facility] [place] Follow up.   Specialty: General Surgery Why: our office will call you with appointment date and time, likely in about 1 month for follow up of your diverticulitis Contact information: [place] [address] [phone]        gastroenterologist Follow up.   Why: You will  need to follow up for a colonoscopy in 6-8 weeks       Bedsole, Amy E, MD. Schedule an appointment as soon as possible for a visit in 1 week(s).   Specialty: Family Medicine Contact information: [place] [address] [phone]           Major procedures and Radiology Reports - PLEASE review detailed and final reports thoroughly  -        CT ABDOMEN PELVIS W CONTRAST  Result Date: 08/28/2019 CLINICAL DATA:  Diverticulitis suspected EXAM: CT ABDOMEN AND PELVIS WITH CONTRAST TECHNIQUE: Multidetector CT imaging of the abdomen and pelvis was performed using the standard protocol following bolus administration of intravenous contrast. CONTRAST:  128m OMNIPAQUE IOHEXOL 300 MG/ML  SOLN COMPARISON:  Abdominal radiograph Sep 17, 2013 FINDINGS: Lower chest: Lung bases are clear. Normal heart size. No pericardial effusion. Hepatobiliary: No focal liver abnormality is seen. No gallstones, gallbladder wall thickening, or biliary dilatation. Pancreas: Partial fatty replacement of the pancreas. Spleen: Normal in size without focal abnormality. Small accessory splenule. Adrenals/Urinary Tract: Normal adrenal glands. Few subcentimeter hypoattenuating foci in the kidneys too small to fully characterize on CT imaging but statistically likely benign. Kidneys are otherwise unremarkable, without renal calculi, suspicious lesion, or hydronephrosis. Intramural fat noted within the anterior aspect of the bladder wall, may reflect sequela of prior infection or inflammation. Stomach/Bowel: Distal esophagus, stomach and duodenal sweep are unremarkable. No small bowel wall thickening or dilatation. No evidence of obstruction. A normal appendix is visualized. Much of the proximal colon is fluid-filled. There are numerous distal colonic diverticula including a focally thickened segment of the mid  to distal sigmoid arising near the rectosigmoid junction is an inflamed rim enhancing outpouching which may reflect an inflamed colonic diverticula versus contained perforation/intramural abscess which does demonstrate significant surrounding phlegmonous change as well as loss of discernible fat plane with an adjacent loop of the more proximal sigmoid colon as well as the dorsal aspect of the uterus. No extraluminal gas is evident. No extraluminal free fluid is evident. Vascular/Lymphatic: Atherosclerotic plaque within the normal caliber aorta. Reactive adenopathy noted in the deep pelvis in the region of extensive pericolonic inflammation. Reproductive: The anteverted uterus. Loss of discernible fat plane of the posterior/dorsal uterine body and the adjacent inflammatory changes of the sigmoid. No other concerning adnexal abnormality. Other: No free fluid or free air. Extensive phlegmonous change in the deep pelvis, as detailed above. No bowel containing hernias. Musculoskeletal: Multilevel degenerative changes are present in the imaged portions of the spine. No acute osseous abnormality or suspicious osseous lesion. IMPRESSION: 1. Rim enhancing collection arising from the distal sigmoid just proximal to the rectosigmoid junction with extensive surrounding inflammatory/phlegmonous change. This may reflect a contained perforated diverticulum though the extent of inflammatory changes is quite pronounced and does result in reactive thickening and inflammation of a more proximal loop of sigmoid colon as well as loss of discernible fat planes with the posterior/dorsal aspect of the uterus. Developing colo-colonic fistulization is not fully excluded particularly in the absence of luminal contrast media. No extraluminal gas or free fluid is evident. Given this complicated appearance, surgical consultation may be warranted. Furthermore, after resolution of acute symptoms direct visualization of the colon is recommended. 2.  Intramural fat within the anterior aspect of the bladder wall, may reflect sequela of prior infection or inflammation, correlate with patient's symptoms. 3. Aortic Atherosclerosis (ICD10-I70.0). These results were called by telephone at the time of interpretation on 08/28/2019  at 4:56 pm to provider [facility] , who verbally acknowledged these results. Electronically Signed   By: Lovena Le M.D.   On: 08/28/2019 16:57    Micro Results    Recent Results (from the past 240 hour(s))  SARS CORONAVIRUS 2 (TAT 6-24 HRS) Nasopharyngeal Nasopharyngeal Swab     Status: Abnormal   Collection Time: 08/28/19  5:35 PM   Specimen: Nasopharyngeal Swab  Result Value Ref Range Status   SARS Coronavirus 2 POSITIVE (A) NEGATIVE Final    Comment: RESULT CALLED TO, READ BACK BY AND VERIFIED WITH: RN KAY EDWARDS AT 0248 BY MESSAN HOUEGNIFIO ON 08/29/2019 (NOTE) SARS-CoV-2 target nucleic acids are DETECTED. The SARS-CoV-2 RNA is generally detectable in upper and lower respiratory specimens during the acute phase of infection. Positive results are indicative of the presence of SARS-CoV-2 RNA. Clinical correlation with patient history and other diagnostic information is  necessary to determine patient infection status. Positive results do not rule out bacterial infection or co-infection with other viruses.  The expected result is Negative. Fact Sheet for Patients: SugarRoll.be Fact Sheet for Healthcare Providers: https://www.woods-mathews.com/ This test is not yet approved or cleared by the [place] FDA and  has been authorized for detection and/or diagnosis of SARS-CoV-2 by FDA under an Emergency Use Authorization (EUA). This EUA will remain  in effect (meaning this test  can be used) for the duration of the COVID-19 declaration under Section 564(b)(1) of the Act, 21 U.S.C. section 360bbb-3(b)(1), unless the authorization is terminated or revoked sooner. Performed  at [facility], [place] [address], [place], [place] [address]   Culture, blood (routine x 2)     Status: None (Preliminary result)   Collection Time: 08/28/19  6:20 PM   Specimen: BLOOD RIGHT ARM  Result Value Ref Range Status   Specimen Description BLOOD RIGHT ARM  Final   Special Requests   Final    BOTTLES DRAWN AEROBIC AND ANAEROBIC Blood Culture adequate volume   Culture   Final    NO GROWTH 4 DAYS Performed at [facility], [place] [address] [place], [place] [address]    Report Status PENDING  Incomplete  Culture, blood (routine x 2)     Status: None (Preliminary result)   Collection Time: 08/28/19  6:30 PM   Specimen: BLOOD LEFT ARM  Result Value Ref Range Status   Specimen Description BLOOD LEFT ARM  Final   Special Requests   Final    BOTTLES DRAWN AEROBIC AND ANAEROBIC Blood Culture adequate volume   Culture   Final    NO GROWTH 4 DAYS Performed at [facility], [place] [address] [place], [place] [address]    Report Status PENDING  Incomplete    Today   Subjective    Psalms [place] today has no headache,no chest abdominal pain,no new weakness tingling or numbness, feels much better wants to go home today.     Objective   Blood pressure 134/77, pulse 67, temperature 98.9 F (37.2 C), temperature source Oral, resp. rate 20, height 5' 6"  (1.676 m), weight 81 kg, SpO2 95 %.   Intake/Output Summary (Last 24 hours) at 09/01/2019 1203 Last data filed at 08/31/2019 1706 Gross per 24 hour  Intake 536.5 ml  Output --  Net 536.5 ml    Exam Awake Alert, Oriented x 3, No new F.N deficits, Normal affect [place].AT,PERRAL Supple Neck,No JVD, No cervical lymphadenopathy appriciated.  Symmetrical Chest wall movement, Good air movement bilaterally, CTAB RRR,No  Gallops,Rubs or new Murmurs, No Parasternal Heave +ve B.Sounds, Abd Soft, Non tender, No organomegaly appriciated, No rebound -guarding or rigidity. No Cyanosis, Clubbing or edema, No new Rash or bruise   Data  Review   CBC w Diff:  Lab Results  Component Value Date   WBC 5.8 09/01/2019   HGB 13.7 09/01/2019   HCT 40.2 09/01/2019   PLT 338 09/01/2019   LYMPHOPCT 44 09/01/2019   MONOPCT 7 09/01/2019   EOSPCT 2 09/01/2019   BASOPCT 1 09/01/2019    CMP:  Lab Results  Component Value Date   NA 142 09/01/2019   K 3.6 09/01/2019   CL 106 09/01/2019   CO2 26 09/01/2019   BUN <5 (L) 09/01/2019   CREATININE 0.78 09/01/2019   CREATININE 0.77 09/17/2013   PROT 6.4 (L) 09/01/2019   ALBUMIN 3.0 (L) 09/01/2019   BILITOT 0.4 09/01/2019   ALKPHOS 48 09/01/2019   AST 16 09/01/2019   ALT 13 09/01/2019  .   Total Time in preparing paper work, data evaluation and todays exam - 73 minutes  Lala Lund M.D on 09/01/2019 at 12:03 PM  [facility]   Office  [phone]

## 2019-09-01 NOTE — Progress Notes (Signed)
Patient ID: Katie Blake, female   DOB: 1951-02-28, 69 y.o.   MRN: [id number]       Subjective: Patient feeling much better today.  Diarrhea still present but improving still.  Pain much improved today.  Tolerating soft diet with no issues.  ROS: See above, otherwise other systems negative  Objective: Vital signs in last 24 hours: Temp:  [98.3 F (36.8 C)-99 F (37.2 C)] 98.9 F (37.2 C) (04/18 0435) Pulse Rate:  [64-76] 67 (04/18 0435) Resp:  [18-20] 20 (04/18 0435) BP: (131-138)/(73-81) 134/77 (04/18 0435) SpO2:  [93 %-97 %] 95 % (04/18 0435) Last BM Date: 08/31/19  Intake/Output from previous day: 04/17 0701 - 04/18 0700 In: 636.5 [P.O.:220; I.V.:416.5] Out: -  Intake/Output this shift: No intake/output data recorded.  PE: Heart: regular Lungs: CTAB Abd: soft, much less tender, +BS, ND  Lab Results:  Recent Labs    08/31/19 0834 09/01/19 0440  WBC 5.3 5.8  HGB 13.5 13.7  HCT 40.0 40.2  PLT 305 338   BMET Recent Labs    08/31/19 0834 09/01/19 0440  NA 142 142  K 3.6 3.6  CL 107 106  CO2 25 26  GLUCOSE 110* 103*  BUN <5* <5*  CREATININE 0.84 0.78  CALCIUM 8.8* 8.7*   PT/INR No results for input(s): LABPROT, INR in the last 72 hours. CMP     Component Value Date/Time   NA 142 09/01/2019 0440   K 3.6 09/01/2019 0440   CL 106 09/01/2019 0440   CO2 26 09/01/2019 0440   GLUCOSE 103 (H) 09/01/2019 0440   BUN <5 (L) 09/01/2019 0440   CREATININE 0.78 09/01/2019 0440   CREATININE 0.77 09/17/2013 1723   CALCIUM 8.7 (L) 09/01/2019 0440   PROT 6.4 (L) 09/01/2019 0440   ALBUMIN 3.0 (L) 09/01/2019 0440   AST 16 09/01/2019 0440   ALT 13 09/01/2019 0440   ALKPHOS 48 09/01/2019 0440   BILITOT 0.4 09/01/2019 0440   GFRNONAA >60 09/01/2019 0440   GFRAA >60 09/01/2019 0440   Lipase     Component Value Date/Time   LIPASE 18 08/28/2019 1338       Studies/Results: No results found.  Anti-infectives: Anti-infectives (From admission, onward)   Start     Dose/Rate Route Frequency Ordered Stop   08/31/19 1000  amoxicillin-clavulanate (AUGMENTIN) 875-125 MG per tablet 1 tablet     1 tablet Oral Every 12 hours 08/31/19 0909     08/29/19 0600  piperacillin-tazobactam (ZOSYN) IVPB 3.375 g  Status:  Discontinued     3.375 g 12.5 mL/hr over 240 Minutes Intravenous Every 8 hours 08/28/19 1824 08/31/19 0909   08/28/19 1715  piperacillin-tazobactam (ZOSYN) IVPB 3.375 g     3.375 g 100 mL/hr over 30 Minutes Intravenous  Once 08/28/19 1709 08/28/19 1805       Assessment/Plan COVID  Acute sigmoid diverticulitis with possible intraluminal abscess - tolerting soft diet -onoral abx therapy -home with 14 days total of abx therapy - recommend outpatient colonoscopy in 6-8 weeks -given intramural findings and the fact that the patient tells me today she has had multiple episodes of diverticulitis, it would likely be prudent for her to follow up with one of our colorectal specialist to at least discuss possibility of surgery. -surgically stable for DC home.  D/W Dr. Thedore Mins  EXB:MWUX diet VTE: SCDs,loenox ID: Zosyn 4/14>>4/17, Augmentin 4/17 -->   LOS: 4 days    Letha Cape , [facility] 09/01/2019, 9:19 AM Please  see Amion for pager number during day hours 7:00am-4:30pm or 7:00am -11:30am on weekends

## 2019-09-02 ENCOUNTER — Telehealth: Payer: Self-pay

## 2019-09-02 LAB — CULTURE, BLOOD (ROUTINE X 2)
Culture: NO GROWTH
Culture: NO GROWTH
Special Requests: ADEQUATE
Special Requests: ADEQUATE

## 2019-09-02 NOTE — Telephone Encounter (Signed)
Transition Care Management Follow-up Telephone Call  Date of discharge and from where: 09/01/2019, [facility]  How have you been since you were released from the hospital? Patient states that she is feeling much better.  Any questions or concerns? No   Items Reviewed:  Did the pt receive and understand the discharge instructions provided? Yes   Medications obtained and verified? Yes   Any new allergies since your discharge? No   Dietary orders reviewed? Yes  Do you have support at home? Yes   Functional Questionnaire: (I = Independent and D = Dependent) ADLs: I  Bathing/Dressing- I  Meal Prep- I  Eating- I  Maintaining continence- I  Transferring/Ambulation- I  Managing Meds- I  Follow up appointments reviewed:   PCP Hospital f/u appt confirmed? No  Patient wants to follow up with gastroenterology. She doesn't feel the need to see her PCP at this time.   Specialist Hospital f/u appt confirmed? Yes  Scheduled to see gastroenterology.  Are transportation arrangements needed? No   If their condition worsens, is the pt aware to call PCP or go to the Emergency Dept.? Yes  Was the patient provided with contact information for the PCP's office or ED? Yes  Was to pt encouraged to call back with questions or concerns? Yes

## 2019-09-03 ENCOUNTER — Telehealth: Payer: Self-pay | Admitting: Family Medicine

## 2019-09-03 NOTE — Telephone Encounter (Signed)
I will do my best in this situation.

## 2019-09-03 NOTE — Telephone Encounter (Signed)
-----   Message from Shelbie Hutching sent at 09/03/2019  3:24 PM EDT ----- Regarding: RE: PCP follow-up Dr. Patsy Lager,  The patient is refusing to see Dr. Ermalene Searing. She wanted to schedule with you. She has requested to transfer care to [facility]. Toni Amend tried to get her scheduled with Dr. Ermalene Searing.   Thank you, ShaVaughn  ----- Message ----- From: Hannah Beat, MD Sent: 09/03/2019   3:10 PM EDT To: Excell Seltzer, MD, Shelbie Hutching Subject: PCP follow-up                                  Serena Croissant, appointment tomorrow:  It would really be better for the patient's primary care doctor to see her on her hospital discharge evaluation.  There are many things that need to be done on a face-to-face basis, testing, etc.She should follow-up with Dr. Ermalene Searing when she is cleared from a COVID-19 standpoint -nonemergently.  If she is doing quite unwell and is acutely ill compared to when she was in the hospital, then I am happy to talk to her.  Karleen Hampshire

## 2019-09-04 ENCOUNTER — Telehealth (INDEPENDENT_AMBULATORY_CARE_PROVIDER_SITE_OTHER): Payer: Medicare Other | Admitting: Family Medicine

## 2019-09-04 ENCOUNTER — Encounter: Payer: Self-pay | Admitting: Family Medicine

## 2019-09-04 VITALS — Ht 66.25 in

## 2019-09-04 DIAGNOSIS — U071 COVID-19: Secondary | ICD-10-CM

## 2019-09-04 DIAGNOSIS — K5792 Diverticulitis of intestine, part unspecified, without perforation or abscess without bleeding: Secondary | ICD-10-CM

## 2019-09-04 NOTE — Progress Notes (Signed)
Katie Blake T. Katie Wilinski, MD Primary Care and Sports Medicine [facility] [address] [place], [address] Phone: [phone]  FAX: [phone]  Katie Blake - 69 y.o. female  MRN [id number]  Date of Birth: April 20, 1951  Visit Date: 09/04/2019  PCP: Excell Seltzer, MD  Referred by: Excell Seltzer, MD Chief Complaint  Patient presents with  . Hospitalization Follow-up    Diverticulitis/Covid 19   Virtual Visit via Video Note:  I connected with  Katie Blake on 09/04/2019  2:40 PM EDT by a video enabled telemedicine application and verified that I am speaking with the correct person using two identifiers.   Location patient: home computer, tablet, or smartphone Location provider: work or home office Consent: Verbal consent directly obtained from Katie Blake. Persons participating in the virtual visit: patient, provider  I discussed the limitations of evaluation and management by telemedicine and the availability of in person appointments. The patient expressed understanding and agreed to proceed.  History of Present Illness:  TCM 7  Admit date: 08/28/2019  Discharge date: 09/01/2019  I reviewed all the inpatient notes, labs, as well as imaging including the patient CT abdomen and pelvis.  I initially saw the patient on August 26, 2019.  At that point clinically I thought that she had diverticulitis, and she had had multiple episodes of diverticulitis in the past.  I did place her on Cipro and Flagyl, but 2 days later she called me and told me that she was doing quite a bit worse.  I asked her to go to the emergency room she was admitted for 4 days.  On record review, the patient did have a very complex CT of the abdomen and pelvis.  Radiology felt that this may be a possible developing active abscess and that there were multiple other findings that were concerning enough that they consulted general surgery.  See this result below.  The patient  also had a positive COVID-19 test while she was admitted to the hospital.  At this point her primary complaint is some nausea.  She was discharged from the hospital with Augmentin, the patient did not take this at all on her discharge.  She continues to have nauseousness difficulty eating, drinking, as well as some abdominal pain and some loose stool.  BMP / CBC, CXR in 1-2 weeks  + Covid  Nausea still not going away  Has not been taking augmentin  Review of Systems as above: See pertinent positives and pertinent negatives per HPI No acute distress verbally   Observations/Objective/Exam:  An attempt was made to discern vital signs over the phone and per patient if applicable and possible.   General:    Alert, Oriented, appears well and in no acute distress  Pulmonary:     On inspection no signs of respiratory distress.  Psych / Neurological:     Pleasant and cooperative.  CT ABDOMEN PELVIS W CONTRAST  Result Date: 08/28/2019 CLINICAL DATA:  Diverticulitis suspected EXAM: CT ABDOMEN AND PELVIS WITH CONTRAST TECHNIQUE: Multidetector CT imaging of the abdomen and pelvis was performed using the standard protocol following bolus administration of intravenous contrast. CONTRAST:  OMNIPAQUE IOHEXOL 300 MG/ML  SOLN COMPARISON:  Abdominal radiograph Sep 17, 2013 FINDINGS: Lower chest: Lung bases are clear. Normal heart size. No pericardial effusion. Hepatobiliary: No focal liver abnormality is seen. No gallstones, gallbladder wall thickening, or biliary dilatation. Pancreas: Partial fatty replacement of the pancreas. Spleen: Normal  in size without focal abnormality. Small accessory splenule. Adrenals/Urinary Tract: Normal adrenal glands. Few subcentimeter hypoattenuating foci in the kidneys too small to fully characterize on CT imaging but statistically likely benign. Kidneys are otherwise unremarkable, without renal calculi, suspicious lesion, or hydronephrosis. Intramural fat noted within  the anterior aspect of the bladder wall, may reflect sequela of prior infection or inflammation. Stomach/Bowel: Distal esophagus, stomach and duodenal sweep are unremarkable. No small bowel wall thickening or dilatation. No evidence of obstruction. A normal appendix is visualized. Much of the proximal colon is fluid-filled. There are numerous distal colonic diverticula including a focally thickened segment of the mid to distal sigmoid arising near the rectosigmoid junction is an inflamed rim enhancing outpouching which may reflect an inflamed colonic diverticula versus contained perforation/intramural abscess which does demonstrate significant surrounding phlegmonous change as well as loss of discernible fat plane with an adjacent loop of the more proximal sigmoid colon as well as the dorsal aspect of the uterus. No extraluminal gas is evident. No extraluminal free fluid is evident. Vascular/Lymphatic: Atherosclerotic plaque within the normal caliber aorta. Reactive adenopathy noted in the deep pelvis in the region of extensive pericolonic inflammation. Reproductive: The anteverted uterus. Loss of discernible fat plane of the posterior/dorsal uterine body and the adjacent inflammatory changes of the sigmoid. No other concerning adnexal abnormality. Other: No free fluid or free air. Extensive phlegmonous change in the deep pelvis, as detailed above. No bowel containing hernias. Musculoskeletal: Multilevel degenerative changes are present in the imaged portions of the spine. No acute osseous abnormality or suspicious osseous lesion. IMPRESSION: 1. Rim enhancing collection arising from the distal sigmoid just proximal to the rectosigmoid junction with extensive surrounding inflammatory/phlegmonous change. This may reflect a contained perforated diverticulum though the extent of inflammatory changes is quite pronounced and does result in reactive thickening and inflammation of a more proximal loop of sigmoid colon as  well as loss of discernible fat planes with the posterior/dorsal aspect of the uterus. Developing colo-colonic fistulization is not fully excluded particularly in the absence of luminal contrast media. No extraluminal gas or free fluid is evident. Given this complicated appearance, surgical consultation may be warranted. Furthermore, after resolution of acute symptoms direct visualization of the colon is recommended. 2. Intramural fat within the anterior aspect of the bladder wall, may reflect sequela of prior infection or inflammation, correlate with patient's symptoms. 3. Aortic Atherosclerosis (ICD10-I70.0). These results were called by telephone at the time of interpretation on 08/28/2019 at 4:56 pm to provider [facility] , who verbally acknowledged these results. Electronically Signed   By: Kreg Shropshire M.D.   On: 08/28/2019 16:57     Assessment and Plan:    ICD-10-CM   1. Acute diverticulitis  K57.92   2. COVID-19  U07.1    Admitted with severe, complicated diverticulitis.  Possible perforated diverticulum, possible developing colocolic fistula.  Both general surgery and radiology felt that this patient did need to have direct visualization of the colon once her acute symptoms calm down.  Patient also had a number of questions about COVID-19 and if this could relate to her admitting diagnosis and if or how she was treated in the hospital.  We spoke in generally COVID-19 with GI findings is only treated with supportive care.  I did my best to explain her that I think that this is all complicated diverticulitis and now this is lacking further treatment.  The patient has 9 days more of antibiotics, so I told her that she needed  to complete her course of Cipro and Flagyl.  I offered her prescription for Zofran for nausea, but she declined this.  She will be establishing care with a new primary care doctor, Golden Hurter, and I wish her well.  I discussed the assessment and treatment plan with the  patient. The patient was provided an opportunity to ask questions and all were answered. The patient agreed with the plan and demonstrated an understanding of the instructions.   The patient was advised to call back or seek an in-person evaluation if the symptoms worsen or if the condition fails to improve as anticipated.  Follow-up: prn unless noted otherwise below No follow-ups on file.  No orders of the defined types were placed in this encounter.  No orders of the defined types were placed in this encounter.   Signed,  Maud Deed. Reene Harlacher, MD

## 2019-09-10 ENCOUNTER — Other Ambulatory Visit: Payer: Self-pay | Admitting: Family Medicine

## 2019-09-10 DIAGNOSIS — Z1231 Encounter for screening mammogram for malignant neoplasm of breast: Secondary | ICD-10-CM

## 2019-09-26 ENCOUNTER — Other Ambulatory Visit: Payer: Self-pay

## 2019-09-26 ENCOUNTER — Encounter: Payer: Self-pay | Admitting: Internal Medicine

## 2019-09-26 ENCOUNTER — Ambulatory Visit (INDEPENDENT_AMBULATORY_CARE_PROVIDER_SITE_OTHER): Payer: Medicare Other | Admitting: Internal Medicine

## 2019-09-26 VITALS — BP 120/90 | HR 76 | Temp 97.3°F | Ht 66.25 in | Wt 174.0 lb

## 2019-09-26 DIAGNOSIS — E782 Mixed hyperlipidemia: Secondary | ICD-10-CM | POA: Diagnosis not present

## 2019-09-26 DIAGNOSIS — G43009 Migraine without aura, not intractable, without status migrainosus: Secondary | ICD-10-CM

## 2019-09-26 NOTE — Assessment & Plan Note (Signed)
CMET and Lipid profile today Encouraged low salt diet

## 2019-09-26 NOTE — Progress Notes (Signed)
HPI  Patient presents to the clinic today to establish care for management of the conditions listed below.  She is transferring care from Dr. Ermalene Searing.  Migraines: These occur a few times per months to a few times per year. She feels like this is triggered by [facility] [employer] and other seasoning/sauces. She takes Excedrin Migraine with good relief of symptoms.   HLD: Her last LDL was 127, triglycerides 190, 06/2018. She is not taking any cholesterol lowering medications. She does eat some fried foods.  Flu: never Tetanus: 10/2017 Pneumovax: never Prevnar: never Zostavax: never Shingrix: never Covid: never Pap smear: Mammogram: 08/2016 Bone density: 08/2016 Colon screening: 08/2016, Cologuard  Past Medical History:  Diagnosis Date  . Diverticulitis     Current Outpatient Medications  Medication Sig Dispense Refill  . acetaminophen (TYLENOL) 500 MG tablet Take 1,000 mg by mouth daily as needed (for pain).    Marland Kitchen amoxicillin-clavulanate (AUGMENTIN) 875-125 MG tablet Take 1 tablet by mouth every 12 (twelve) hours. (Patient not taking: Reported on 09/04/2019) 20 tablet 0  . dimenhyDRINATE (DRAMAMINE) 50 MG tablet Take 50 mg by mouth every 8 (eight) hours as needed for nausea or dizziness.     No current facility-administered medications for this visit.    Allergies  Allergen Reactions  . Sulfa Antibiotics     Family History  Problem Relation Age of Onset  . Diabetes Mother   . Cancer Father 31        brain tumor  . Asthma Brother     Social History   Socioeconomic History  . Marital status: Single    Spouse name: Not on file  . Number of children: Not on file  . Years of education: Not on file  . Highest education level: Not on file  Occupational History  . Not on file  Tobacco Use  . Smoking status: Never Smoker  . Smokeless tobacco: Never Used  Substance and Sexual Activity  . Alcohol use: No  . Drug use: No  . Sexual activity: Not Currently  Other Topics Concern  .  Not on file  Social History Narrative  . Not on file   Social Determinants of Health   Financial Resource Strain:   . Difficulty of Paying Living Expenses:   Food Insecurity:   . Worried About Programme researcher, broadcasting/film/video in the Last Year:   . Barista in the Last Year:   Transportation Needs:   . Freight forwarder (Medical):   Marland Kitchen Lack of Transportation (Non-Medical):   Physical Activity:   . Days of Exercise per Week:   . Minutes of Exercise per Session:   Stress:   . Feeling of Stress :   Social Connections:   . Frequency of Communication with Friends and Family:   . Frequency of Social Gatherings with Friends and Family:   . Attends Religious Services:   . Active Member of Clubs or Organizations:   . Attends Banker Meetings:   Marland Kitchen Marital Status:   Intimate Partner Violence:   . Fear of Current or Ex-Partner:   . Emotionally Abused:   Marland Kitchen Physically Abused:   . Sexually Abused:     ROS:  Constitutional: Pt reports intermittent headache. Denies fever, malaise, fatigue, or abrupt weight changes.  HEENT: Denies eye pain, eye redness, ear pain, ringing in the ears, wax buildup, runny nose, nasal congestion, bloody nose, or sore throat. Respiratory: Denies difficulty breathing, shortness of breath, cough or sputum production.  Cardiovascular: Denies chest pain, chest tightness, palpitations or swelling in the hands or feet.  Gastrointestinal: Denies abdominal pain, bloating, constipation, diarrhea or blood in the stool.  GU: Denies frequency, urgency, pain with urination, blood in urine, odor or discharge. Musculoskeletal: Denies decrease in range of motion, difficulty with gait, muscle pain or joint pain and swelling.  Skin: Denies redness, rashes, lesions or ulcercations.  Neurological: Denies dizziness, difficulty with memory, difficulty with speech or problems with balance and coordination.  Psych: Denies anxiety, depression, SI/HI.  No other specific  complaints in a complete review of systems (except as listed in HPI above).  PE:  BP 120/90 (BP Location: Left Arm, Patient Position: Sitting, Cuff Size: Normal)   Pulse 76   Temp (!) 97.3 F (36.3 C) (Oral)   Ht 5' 6.25" (1.683 m)   Wt 174 lb (78.9 kg)   SpO2 98%   BMI 27.87 kg/m   Wt Readings from Last 3 Encounters:  08/29/19 178 lb 9.2 oz (81 kg)  08/26/19 177 lb 4 oz (80.4 kg)  05/02/19 182 lb 8 oz (82.8 kg)    General: Appears her stated age, well developed, well nourished in NAD. Cardiovascular: Normal rate and rhythm. S1,S2 noted.  No murmur, rubs or gallops noted. No JVD or BLE edema.  Pulmonary/Chest: Normal effort and positive vesicular breath sounds. No respiratory distress. No wheezes, rales or ronchi noted.  Musculoskeletal:  No difficulty with gait.  Neurological: Alert and oriented.  Psychiatric: Mood and affect normal. Behavior is normal. Judgment and thought content normal.     BMET    Component Value Date/Time   NA 142 09/01/2019 0440   K 3.6 09/01/2019 0440   CL 106 09/01/2019 0440   CO2 26 09/01/2019 0440   GLUCOSE 103 (H) 09/01/2019 0440   BUN <5 (L) 09/01/2019 0440   CREATININE 0.78 09/01/2019 0440   CREATININE 0.77 09/17/2013 1723   CALCIUM 8.7 (L) 09/01/2019 0440   GFRNONAA >60 09/01/2019 0440   GFRAA >60 09/01/2019 0440    Lipid Panel     Component Value Date/Time   CHOL 202 (H) 06/18/2018 0824   TRIG 190.0 (H) 06/18/2018 0824   HDL 37.00 (L) 06/18/2018 0824   CHOLHDL 5 06/18/2018 0824   VLDL 38.0 06/18/2018 0824   LDLCALC 127 (H) 06/18/2018 0824    CBC    Component Value Date/Time   WBC 5.8 09/01/2019 0440   RBC 4.72 09/01/2019 0440   HGB 13.7 09/01/2019 0440   HCT 40.2 09/01/2019 0440   PLT 338 09/01/2019 0440   MCV 85.2 09/01/2019 0440   MCV 86.9 10/06/2013 1838   MCH 29.0 09/01/2019 0440   MCHC 34.1 09/01/2019 0440   RDW 11.8 09/01/2019 0440   LYMPHSABS 2.6 09/01/2019 0440   MONOABS 0.4 09/01/2019 0440   EOSABS 0.1  09/01/2019 0440   BASOSABS 0.0 09/01/2019 0440    Hgb A1C No results found for: HGBA1C   Assessment and Plan:   Webb Silversmith, NP This visit occurred during the SARS-CoV-2 public health emergency.  Safety protocols were in place, including screening questions prior to the visit, additional usage of staff PPE, and extensive cleaning of exam room while observing appropriate contact time as indicated for disinfecting solutions.

## 2019-09-26 NOTE — Patient Instructions (Signed)

## 2019-09-26 NOTE — Assessment & Plan Note (Signed)
Continue Excedrin migraine Avoid triggers

## 2019-09-27 LAB — COMPREHENSIVE METABOLIC PANEL
ALT: 15 U/L (ref 0–35)
AST: 20 U/L (ref 0–37)
Albumin: 4.4 g/dL (ref 3.5–5.2)
Alkaline Phosphatase: 60 U/L (ref 39–117)
BUN: 10 mg/dL (ref 6–23)
CO2: 25 mEq/L (ref 19–32)
Calcium: 9.6 mg/dL (ref 8.4–10.5)
Chloride: 104 mEq/L (ref 96–112)
Creatinine, Ser: 0.88 mg/dL (ref 0.40–1.20)
GFR: 63.81 mL/min (ref >60.00)
Glucose, Bld: 94 mg/dL (ref 70–99)
Potassium: 4.2 mEq/L (ref 3.5–5.1)
Sodium: 138 mEq/L (ref 135–145)
Total Bilirubin: 0.5 mg/dL (ref 0.2–1.2)
Total Protein: 7.6 g/dL (ref 6.0–8.3)

## 2019-09-27 LAB — LIPID PANEL
Cholesterol: 256 mg/dL — ABNORMAL HIGH (ref 0–200)
HDL: 41.4 mg/dL (ref >39.00)
LDL Cholesterol: 176 mg/dL — ABNORMAL HIGH (ref 0–99)
NonHDL: 214.35
Total CHOL/HDL Ratio: 6
Triglycerides: 193 mg/dL — ABNORMAL HIGH (ref 0.0–149.0)
VLDL: 38.6 mg/dL (ref 0.0–40.0)

## 2019-09-27 LAB — CBC
HCT: 45.8 % (ref 36.0–46.0)
Hemoglobin: 15.6 g/dL — ABNORMAL HIGH (ref 12.0–15.0)
MCHC: 34.2 g/dL (ref 30.0–36.0)
MCV: 85.5 fl (ref 78.0–100.0)
Platelets: 217 10*3/uL (ref 150.0–400.0)
RBC: 5.35 Mil/uL — ABNORMAL HIGH (ref 3.87–5.11)
RDW: 13.4 % (ref 11.5–15.5)
WBC: 5.3 10*3/uL (ref 4.0–10.5)

## 2019-09-27 LAB — VITAMIN D 25 HYDROXY (VIT D DEFICIENCY, FRACTURES): VITD: 20.72 ng/mL — ABNORMAL LOW (ref 30.00–100.00)

## 2019-10-03 MED ORDER — VITAMIN D (ERGOCALCIFEROL) 1.25 MG (50000 UNIT) PO CAPS: 50000.0000 [IU] | ORAL_CAPSULE | ORAL | 0 refills | Status: DC

## 2019-10-03 NOTE — Addendum Note (Signed)
Addended by: Roena Malady on: 10/03/2019 04:30 PM   Modules accepted: Orders

## 2019-10-28 ENCOUNTER — Encounter: Payer: Self-pay | Admitting: Internal Medicine

## 2019-10-28 ENCOUNTER — Ambulatory Visit (INDEPENDENT_AMBULATORY_CARE_PROVIDER_SITE_OTHER): Payer: Medicare Other | Admitting: Internal Medicine

## 2019-10-28 ENCOUNTER — Other Ambulatory Visit: Payer: Self-pay

## 2019-10-28 VITALS — BP 122/78 | HR 74 | Temp 98.1°F | Ht 66.25 in | Wt 179.0 lb

## 2019-10-28 DIAGNOSIS — Z0000 Encounter for general adult medical examination without abnormal findings: Secondary | ICD-10-CM

## 2019-10-28 NOTE — Patient Instructions (Signed)

## 2019-10-28 NOTE — Progress Notes (Signed)
HPI:  Pt presents to the clinic today for her subsequent annual Medicare Wellness Exam.   Past Medical History:  Diagnosis Date  . Diverticulitis     Current Outpatient Medications  Medication Sig Dispense Refill  . acetaminophen (TYLENOL) 500 MG tablet Take 1,000 mg by mouth daily as needed (for pain).    . Vitamin D, Ergocalciferol, (DRISDOL) 1.25 MG (50000 UNIT) CAPS capsule Take 1 capsule (50,000 Units total) by mouth every 7 (seven) days. 12 capsule 0   No current facility-administered medications for this visit.    Allergies  Allergen Reactions  . Sulfa Antibiotics     Family History  Problem Relation Age of Onset  . Diabetes Mother   . Cancer Father 35        brain tumor  . Asthma Brother     Social History   Socioeconomic History  . Marital status: Single    Spouse name: Not on file  . Number of children: Not on file  . Years of education: Not on file  . Highest education level: Not on file  Occupational History  . Not on file  Tobacco Use  . Smoking status: Never Smoker  . Smokeless tobacco: Never Used  Vaping Use  . Vaping Use: Never used  Substance and Sexual Activity  . Alcohol use: No  . Drug use: No  . Sexual activity: Not Currently  Other Topics Concern  . Not on file  Social History Narrative  . Not on file   Social Determinants of Health   Financial Resource Strain:   . Difficulty of Paying Living Expenses:   Food Insecurity:   . Worried About Programme researcher, broadcasting/film/video in the Last Year:   . Barista in the Last Year:   Transportation Needs:   . Freight forwarder (Medical):   Marland Kitchen Lack of Transportation (Non-Medical):   Physical Activity:   . Days of Exercise per Week:   . Minutes of Exercise per Session:   Stress:   . Feeling of Stress :   Social Connections:   . Frequency of Communication with Friends and Family:   . Frequency of Social Gatherings with Friends and Family:   . Attends Religious Services:   . Active Member  of Clubs or Organizations:   . Attends Banker Meetings:   Marland Kitchen Marital Status:   Intimate Partner Violence:   . Fear of Current or Ex-Partner:   . Emotionally Abused:   Marland Kitchen Physically Abused:   . Sexually Abused:     Hospitiliaztions: None  Health Maintenance:    Flu: never  Tetanus: 10/2017  Pneumovax: never  Prevnar: never  Zostavax: never  Shingrix: never  Covid: never  Mammogram: 08/2016  Pap Smear: usnure  Bone Density: 08/2016  Colon Screening: 08/2016, Cologuard  Eye Doctor: annually  Dental Exam: as needed   Providers:   PCP: Nicki Reaper, NP   I have personally reviewed and have noted:  1. The patient's medical and social history 2. Their use of alcohol, tobacco or illicit drugs 3. Their current medications and supplements 4. The patient's functional ability including ADL's, fall risks, home safety risks and hearing or visual impairment. 5. Diet and physical activities 6. Evidence for depression or mood disorder  Subjective:   Review of Systems:   Constitutional: Denies fever, malaise, fatigue, headache or abrupt weight changes.  HEENT: Denies eye pain, eye redness, ear pain, ringing in the ears, wax buildup, runny nose, nasal congestion, bloody  nose, or sore throat. Respiratory: Denies difficulty breathing, shortness of breath, cough or sputum production.   Cardiovascular: Denies chest pain, chest tightness, palpitations or swelling in the hands or feet.  Gastrointestinal: Denies abdominal pain, bloating, constipation, diarrhea or blood in the stool.  GU: Denies urgency, frequency, pain with urination, burning sensation, blood in urine, odor or discharge. Musculoskeletal: Denies decrease in range of motion, difficulty with gait, muscle pain or joint pain and swelling.  Skin: Denies redness, rashes, lesions or ulcercations.  Neurological: Denies dizziness, difficulty with memory, difficulty with speech or problems with balance and coordination.   Psych: Denies anxiety, depression, SI/HI.  No other specific complaints in a complete review of systems (except as listed in HPI above).  Objective:  PE:  BP 122/78   Pulse 74   Temp 98.1 F (36.7 C) (Temporal)   Ht 5' 6.25" (1.683 m)   Wt 179 lb (81.2 kg)   SpO2 98%   BMI 28.67 kg/m   Wt Readings from Last 3 Encounters:  09/26/19 174 lb (78.9 kg)  08/29/19 178 lb 9.2 oz (81 kg)  08/26/19 177 lb 4 oz (80.4 kg)    General: Appears her stated age, well developed, well nourished in NAD. Skin: Warm, dry and intact. No rashesnoted. HEENT: Head: normal shape and size; Eyes: sclera white, no icterus, conjunctiva pink, PERRLA and EOMs intact; Neck: Neck supple, trachea midline. No masses, lumps or thyromegaly present.  Cardiovascular: Normal rate and rhythm. S1,S2 noted.  No murmur, rubs or gallops noted. No JVD or BLE edema. No carotid bruits noted. Pulmonary/Chest: Normal effort and positive vesicular breath sounds. No respiratory distress. No wheezes, rales or ronchi noted.  Abdomen: Soft and nontender. Normal bowel sounds. No distention or masses noted. Liver, spleen and kidneys non palpable. Musculoskeletal:  Strength 5/5 BUE/BLE. No difficulty with gait. Neurological: Alert and oriented. Cranial nerves II-XII grossly intact. Coordination normal.  Psychiatric: Mood and affect normal. Behavior is normal. Judgment and thought content normal.    BMET    Component Value Date/Time   NA 138 09/26/2019 1447   K 4.2 09/26/2019 1447   CL 104 09/26/2019 1447   CO2 25 09/26/2019 1447   GLUCOSE 94 09/26/2019 1447   BUN 10 09/26/2019 1447   CREATININE 0.88 09/26/2019 1447   CREATININE 0.77 09/17/2013 1723   CALCIUM 9.6 09/26/2019 1447   GFRNONAA >60 09/01/2019 0440   GFRAA >60 09/01/2019 0440    Lipid Panel     Component Value Date/Time   CHOL 256 (H) 09/26/2019 1447   TRIG 193.0 (H) 09/26/2019 1447   HDL 41.40 09/26/2019 1447   CHOLHDL 6 09/26/2019 1447   VLDL 38.6  09/26/2019 1447   LDLCALC 176 (H) 09/26/2019 1447    CBC    Component Value Date/Time   WBC 5.3 09/26/2019 1447   RBC 5.35 (H) 09/26/2019 1447   HGB 15.6 (H) 09/26/2019 1447   HCT 45.8 09/26/2019 1447   PLT 217.0 09/26/2019 1447   MCV 85.5 09/26/2019 1447   MCV 86.9 10/06/2013 1838   MCH 29.0 09/01/2019 0440   MCHC 34.2 09/26/2019 1447   RDW 13.4 09/26/2019 1447   LYMPHSABS 2.6 09/01/2019 0440   MONOABS 0.4 09/01/2019 0440   EOSABS 0.1 09/01/2019 0440   BASOSABS 0.0 09/01/2019 0440    Hgb A1C No results found for: HGBA1C    Assessment and Plan:   Medicare Annual Wellness Visit:  Diet: She does eat meat. She consumes fruits and veggies daily. She does  eat some fried foods. She drinks mostly coffee, tea, soda. Physical activity: None Depression/mood screen: Negative, PHQ 9 score of 0 Hearing: Intact to whispered voice Visual acuity: Grossly normal, performs annual eye exam  ADLs: Capable Fall risk: None Home safety: Good Cognitive evaluation: Intact to orientation, naming, recall and repetition EOL planning: No adv directives, full code/ I agree  Preventative Medicine: Encouraged her to get a flu shot in the fall. Tetanus UTD. She declines pneumovax, prevnar, zostovax, shingrix, covid, pap smear, mammogram, bone density exam or colon cancer screening. Encouraged her to consume a balanced diet and exercise regimen. Advised her to see an eye doctor and dentist annually. Labs from last visit reviewed. Due dates for screening exam given to pt as part of her AVS.   Next appointment: 2 months, lab only lipid check   Webb Silversmith, NP This visit occurred during the SARS-CoV-2 public health emergency.  Safety protocols were in place, including screening questions prior to the visit, additional usage of staff PPE, and extensive cleaning of exam room while observing appropriate contact time as indicated for disinfecting solutions.

## 2019-11-10 ENCOUNTER — Encounter (HOSPITAL_COMMUNITY): Payer: Self-pay | Admitting: Emergency Medicine

## 2019-11-10 ENCOUNTER — Other Ambulatory Visit: Payer: Self-pay

## 2019-11-10 ENCOUNTER — Ambulatory Visit (HOSPITAL_COMMUNITY)
Admission: EM | Admit: 2019-11-10 | Discharge: 2019-11-10 | Disposition: A | Discharge status: Home / Self Care | Payer: Medicare Other | Attending: Family Medicine | Admitting: Family Medicine

## 2019-11-10 DIAGNOSIS — J209 Acute bronchitis, unspecified: Secondary | ICD-10-CM

## 2019-11-10 DIAGNOSIS — R0981 Nasal congestion: Secondary | ICD-10-CM

## 2019-11-10 MED ORDER — BENZONATATE 100 MG PO CAPS: 100.0000 mg | ORAL_CAPSULE | Freq: Three times a day (TID) | ORAL | 0 refills | Status: DC

## 2019-11-10 MED ORDER — PREDNISONE 10 MG (21) PO TBPK: ORAL_TABLET | Freq: Every day | ORAL | 0 refills | Status: AC

## 2019-11-10 MED ORDER — ALBUTEROL SULFATE HFA 108 (90 BASE) MCG/ACT IN AERS: 2.0000 | INHALATION_SPRAY | RESPIRATORY_TRACT | 0 refills | Status: DC | PRN

## 2019-11-10 MED ORDER — CETIRIZINE-PSEUDOEPHEDRINE ER 5-120 MG PO TB12: 1.0000 | ORAL_TABLET | Freq: Every day | ORAL | 0 refills | Status: DC

## 2019-11-10 NOTE — ED Triage Notes (Signed)
Cough and sore throat for 2 days. COVID positive in April 2021.

## 2019-11-10 NOTE — ED Provider Notes (Signed)
[facility]   [id number] 11/10/19 Arrival Time: 1220   SUBJECTIVE:  Katie Blake is a 69 y.o. female who presents with complaint of nasal congestion, post-nasal drainage, and a persistent cough. Onset abrupt approximately 3 days ago. Overall fatigued. SOB: none. Wheezing: mild. OTC treatment: none. Social History   Tobacco Use  Smoking Status Never Smoker  Smokeless Tobacco Never Used    ROS: As per HPI.   OBJECTIVE:  Vitals:   11/10/19 1320  BP: 139/75  Pulse: 64  Resp: 18  Temp: 98.3 F (36.8 C)  TempSrc: Oral  SpO2: 97%     General appearance: alert; no distress HEENT: nasal congestion; clear runny nose; throat irritation secondary to post-nasal drainage Neck: supple without LAD Lungs: mild wheezing to bilateral lower lobes Skin: warm and dry Psychological: alert and cooperative; normal mood and affect  Results for orders placed or performed in visit on 09/26/19  CBC  Result Value Ref Range   WBC 5.3 4.0 - 10.5 K/uL   RBC 5.35 (H) 3.87 - 5.11 Mil/uL   Platelets 217.0 150 - 400 K/uL   Hemoglobin 15.6 (H) 12.0 - 15.0 g/dL   HCT 74.2 36 - 46 %   MCV 85.5 78.0 - 100.0 fl   MCHC 34.2 30.0 - 36.0 g/dL   RDW 59.5 63.8 - 75.6 %  Comprehensive metabolic panel  Result Value Ref Range   Sodium 138 135 - 145 mEq/L   Potassium 4.2 3.5 - 5.1 mEq/L   Chloride 104 96 - 112 mEq/L   CO2 25 19 - 32 mEq/L   Glucose, Bld 94 70 - 99 mg/dL   BUN 10 6 - 23 mg/dL   Creatinine, Ser 4.33 0.40 - 1.20 mg/dL   Total Bilirubin 0.5 0.2 - 1.2 mg/dL   Alkaline Phosphatase 60 39 - 117 U/L   AST 20 0 - 37 U/L   ALT 15 0 - 35 U/L   Total Protein 7.6 6.0 - 8.3 g/dL   Albumin 4.4 3.5 - 5.2 g/dL   GFR 29.51 >88.41 mL/min   Calcium 9.6 8.4 - 10.5 mg/dL  Lipid panel  Result Value Ref Range   Cholesterol 256 (H) 0 - 200 mg/dL   Triglycerides 660.6 (H) 0 - 149 mg/dL   HDL 30.16 >01.09 mg/dL   VLDL 32.3 0.0 - 55.7 mg/dL   LDL Cholesterol 322 (H) 0 - 99 mg/dL   Total  CHOL/HDL Ratio 6    NonHDL 214.35   VITAMIN D 25 Hydroxy (Vit-D Deficiency, Fractures)  Result Value Ref Range   VITD 20.72 (L) 30.00 - 100.00 ng/mL    Labs Reviewed - No data to display  No results found.  Allergies  Allergen Reactions   Sulfa Antibiotics     Past Medical History:  Diagnosis Date   Diverticulitis    Social History   Socioeconomic History   Marital status: Single    Spouse name: Not on file   Number of children: Not on file   Years of education: Not on file   Highest education level: Not on file  Occupational History   Not on file  Tobacco Use   Smoking status: Never Smoker   Smokeless tobacco: Never Used  Vaping Use   Vaping Use: Never used  Substance and Sexual Activity   Alcohol use: No   Drug use: No   Sexual activity: Not Currently  Other Topics Concern   Not on file  Social History Narrative   Not on  file   Social Determinants of Health   Financial Resource Strain:    Difficulty of Paying Living Expenses:   Food Insecurity:    Worried About Charity fundraiser in the Last Year:    Arboriculturist in the Last Year:   Transportation Needs:    Film/video editor (Medical):    Lack of Transportation (Non-Medical):   Physical Activity:    Days of Exercise per Week:    Minutes of Exercise per Session:   Stress:    Feeling of Stress :   Social Connections:    Frequency of Communication with Friends and Family:    Frequency of Social Gatherings with Friends and Family:    Attends Religious Services:    Active Member of Clubs or Organizations:    Attends Music therapist:    Marital Status:   Intimate Partner Violence:    Fear of Current or Ex-Partner:    Emotionally Abused:    Physically Abused:    Sexually Abused:    Family History  Problem Relation Age of Onset   Diabetes Mother    Cancer Father 54        brain tumor   Asthma Brother      ASSESSMENT & PLAN:  1.  Acute bronchitis, unspecified organism   2. Nasal congestion     Meds ordered this encounter  Medications   cetirizine-pseudoephedrine (ZYRTEC-D) 5-120 MG tablet    Sig: Take 1 tablet by mouth daily.    Dispense:  30 tablet    Refill:  0    Order Specific Question:   Supervising Provider    Answer:   Chase Picket [[id number]]   predniSONE (STERAPRED UNI-PAK 21 TAB) 10 MG (21) TBPK tablet    Sig: Take by mouth daily for 6 days. Take 6 tablets on day 1, 5 tablets on day 2, 4 tablets on day 3, 3 tablets on day 4, 2 tablets on day 5, 1 tablet on day 6    Dispense:  21 tablet    Refill:  0    Order Specific Question:   Supervising Provider    Answer:   Chase Picket [[id number]]   albuterol (VENTOLIN HFA) 108 (90 Base) MCG/ACT inhaler    Sig: Inhale 2 puffs into the lungs every 4 (four) hours as needed for wheezing or shortness of breath.    Dispense:  18 g    Refill:  0    Order Specific Question:   Supervising Provider    Answer:   Chase Picket [[id number]]   benzonatate (TESSALON) 100 MG capsule    Sig: Take 1 capsule (100 mg total) by mouth every 8 (eight) hours.    Dispense:  21 capsule    Refill:  0    Order Specific Question:   Supervising Provider    Answer:   Chase Picket [id number]    Will treat with above therapy given duration of symptoms.    OTC symptom care as needed. Will plan f/u with PCP or here as needed.  Reviewed expectations re: course of current medical issues. Questions answered. Outlined signs and symptoms indicating need for more acute intervention. Patient verbalized understanding. After Visit Summary given.            Faustino Congress, NP 11/10/19 1354

## 2019-11-10 NOTE — Discharge Instructions (Addendum)
Have sent in an inhaler for you to use and you are having coughing fits during the day  I have sent in a prednisone taper for you to take for 6 days. 6 tablets on day one, 5 tablets on day two, 4 tablets on day three, 3 tablets on day four, 2 tablets on day five, and 1 tablet on day six.  Also sent in Zyrtec-D for you to use as a decongestant  If you notice any fever, noticed anything that you are blowing her nose or coughing up is a dark color, began to experience chills, body aches, or other symptoms follow-up with this office or with primary care as needed.  If you are having difficulty breathing, difficulty swallowing, follow-up with the ER.

## 2019-12-13 DIAGNOSIS — H5213 Myopia, bilateral: Secondary | ICD-10-CM | POA: Diagnosis not present

## 2020-01-15 ENCOUNTER — Other Ambulatory Visit: Payer: Self-pay | Admitting: Internal Medicine

## 2021-04-01 ENCOUNTER — Ambulatory Visit (INDEPENDENT_AMBULATORY_CARE_PROVIDER_SITE_OTHER): Payer: Medicare Other

## 2021-04-01 ENCOUNTER — Other Ambulatory Visit: Payer: Self-pay

## 2021-04-01 ENCOUNTER — Encounter (HOSPITAL_COMMUNITY): Payer: Self-pay | Admitting: Emergency Medicine

## 2021-04-01 ENCOUNTER — Ambulatory Visit (HOSPITAL_COMMUNITY)
Admission: EM | Admit: 2021-04-01 | Discharge: 2021-04-01 | Disposition: A | Discharge status: Home / Self Care | Payer: Medicare Other | Attending: Internal Medicine | Admitting: Internal Medicine

## 2021-04-01 DIAGNOSIS — J209 Acute bronchitis, unspecified: Secondary | ICD-10-CM

## 2021-04-01 DIAGNOSIS — R059 Cough, unspecified: Secondary | ICD-10-CM

## 2021-04-01 DIAGNOSIS — R0602 Shortness of breath: Secondary | ICD-10-CM

## 2021-04-01 LAB — CBC WITH DIFFERENTIAL/PLATELET
Abs Immature Granulocytes: 0.01 10*3/uL (ref 0.00–0.07)
Basophils Absolute: 0 10*3/uL (ref 0.0–0.1)
Basophils Relative: 1 %
Eosinophils Absolute: 0.2 10*3/uL (ref 0.0–0.5)
Eosinophils Relative: 4 %
HCT: 46.5 % — ABNORMAL HIGH (ref 36.0–46.0)
Hemoglobin: 16.1 g/dL — ABNORMAL HIGH (ref 12.0–15.0)
Immature Granulocytes: 0 %
Lymphocytes Relative: 49 %
Lymphs Abs: 2 10*3/uL (ref 0.7–4.0)
MCH: 29.7 pg (ref 26.0–34.0)
MCHC: 34.6 g/dL (ref 30.0–36.0)
MCV: 85.6 fL (ref 80.0–100.0)
Monocytes Absolute: 0.5 10*3/uL (ref 0.1–1.0)
Monocytes Relative: 11 %
Neutro Abs: 1.4 10*3/uL — ABNORMAL LOW (ref 1.7–7.7)
Neutrophils Relative %: 35 %
Platelets: 209 10*3/uL (ref 150–400)
RBC: 5.43 MIL/uL — ABNORMAL HIGH (ref 3.87–5.11)
RDW: 13 % (ref 11.5–15.5)
WBC: 4.1 10*3/uL (ref 4.0–10.5)
nRBC: 0 % (ref 0.0–0.2)

## 2021-04-01 MED ORDER — CETIRIZINE-PSEUDOEPHEDRINE ER 5-120 MG PO TB12: 1.0000 | ORAL_TABLET | Freq: Every day | ORAL | 0 refills | Status: DC

## 2021-04-01 MED ORDER — BENZONATATE 100 MG PO CAPS: 100.0000 mg | ORAL_CAPSULE | Freq: Three times a day (TID) | ORAL | 0 refills | Status: DC | PRN

## 2021-04-01 MED ORDER — PREDNISONE 20 MG PO TABS: 20.0000 mg | ORAL_TABLET | Freq: Every day | ORAL | 0 refills | Status: AC

## 2021-04-01 MED ORDER — ALBUTEROL SULFATE HFA 108 (90 BASE) MCG/ACT IN AERS: 2.0000 | INHALATION_SPRAY | RESPIRATORY_TRACT | 0 refills | Status: DC | PRN

## 2021-04-01 NOTE — Discharge Instructions (Addendum)
Please take medications as prescribed Your chest x-ray is negative for pneumonia Return to urgent care if symptoms worsen. 

## 2021-04-01 NOTE — ED Triage Notes (Signed)
Pt reports that had a cough for a week as well as SOB when laying down at night or talking and exertion.

## 2021-04-01 NOTE — ED Provider Notes (Signed)
[facility]    CSN: [id number] Arrival date & time: 04/01/21  4196      History   Chief Complaint Chief Complaint  Patient presents with   Cough    HPI Katie Blake is a 69 y.o. female comes to the urgent care with a 1 week history of a nonproductive cough, shortness of breath and chest tightness.  Patient's symptoms started insidiously and has been persistent.  No fever or chills.  Cough is productive of clear sputum.  She denies any wheezing.  No generalized body aches.  Patient was exposed to somebody with similar symptoms about a couple of weeks ago.  No vomiting or diarrhea.  Patient is not vaccinated against COVID or flu.  No orthopnea or paroxysmal nocturnal dyspnea.  No lower extremity swelling. HPI  Past Medical History:  Diagnosis Date   Diverticulitis     Patient Active Problem List   Diagnosis Date Noted   Mixed hyperlipidemia 09/26/2019   Common migraine without intractability 08/18/2016    Past Surgical History:  Procedure Laterality Date    shoulder surgery  1988    rotator cuff, bilateral   TARSAL TUNNEL RELEASE     left    OB History   No obstetric history on file.      Home Medications    Prior to Admission medications   Medication Sig Start Date End Date Taking? Authorizing Provider  benzonatate (TESSALON) 100 MG capsule Take 1 capsule (100 mg total) by mouth 3 (three) times daily as needed for cough. 04/01/21  Yes Jeaninne Lodico, Britta Mccreedy, MD  predniSONE (DELTASONE) 20 MG tablet Take 1 tablet (20 mg total) by mouth daily for 5 days. 04/01/21 04/06/21 Yes Shmuel Girgis, Britta Mccreedy, MD  acetaminophen (TYLENOL) 500 MG tablet Take 1,000 mg by mouth daily as needed (for pain).    [provider]  albuterol (VENTOLIN HFA) 108 (90 Base) MCG/ACT inhaler Inhale 2 puffs into the lungs every 4 (four) hours as needed for wheezing or shortness of breath. 04/01/21   LampteyBritta Mccreedy, MD  cetirizine-pseudoephedrine (ZYRTEC-D) 5-120 MG tablet Take 1  tablet by mouth daily. 04/01/21   Partick Musselman, Britta Mccreedy, MD  Vitamin D, Ergocalciferol, (DRISDOL) 1.25 MG (50000 UNIT) CAPS capsule Take 1 capsule (50,000 Units total) by mouth every 7 (seven) days. 10/03/19   Lorre Munroe, NP    Family History Family History  Problem Relation Age of Onset   Diabetes Mother    Cancer Father 6        brain tumor   Asthma Brother     Social History Social History   Tobacco Use   Smoking status: Never   Smokeless tobacco: Never  Vaping Use   Vaping Use: Never used  Substance Use Topics   Alcohol use: No   Drug use: No     Allergies   Sulfa antibiotics   Review of Systems Review of Systems  Constitutional: Negative.   Respiratory:  Positive for cough, chest tightness and shortness of breath. Negative for wheezing.   Gastrointestinal: Negative.   Musculoskeletal: Negative.     Physical Exam Triage Vital Signs ED Triage Vitals  Enc Vitals Group     BP 04/01/21 0851 133/85     Pulse Rate 04/01/21 0851 91     Resp 04/01/21 0851 18     Temp 04/01/21 0851 98.7 F (37.1 C)     Temp Source 04/01/21 0851 Oral     SpO2 04/01/21 0851 95 %  Weight --      Height --      Head Circumference --      Peak Flow --      Pain Score 04/01/21 0849 0     Pain Loc --      Pain Edu? --      Excl. in GC? --    No data found.  Updated Vital Signs BP 133/85 (BP Location: Left Arm)   Pulse 91   Temp 98.7 F (37.1 C) (Oral)   Resp 18   SpO2 95%   Visual Acuity Right Eye Distance:   Left Eye Distance:   Bilateral Distance:    Right Eye Near:   Left Eye Near:    Bilateral Near:     Physical Exam Vitals and nursing note reviewed.  Constitutional:      General: She is not in acute distress.    Appearance: She is not ill-appearing or diaphoretic.  Cardiovascular:     Rate and Rhythm: Normal rate and regular rhythm.     Pulses: Normal pulses.     Heart sounds: Normal heart sounds.  Pulmonary:     Effort: Pulmonary effort is normal.      Breath sounds: Normal breath sounds.  Abdominal:     General: Bowel sounds are normal.     Palpations: Abdomen is soft.  Musculoskeletal:        General: Normal range of motion.  Neurological:     Mental Status: She is alert.     UC Treatments / Results  Labs (all labs ordered are listed, but only abnormal results are displayed) Labs Reviewed  CBC WITH DIFFERENTIAL/PLATELET - Abnormal; Notable for the following components:      Result Value   RBC 5.43 (*)    Hemoglobin 16.1 (*)    HCT 46.5 (*)    Neutro Abs 1.4 (*)    All other components within normal limits    EKG   Radiology DG Chest 2 View  Result Date: 04/01/2021 CLINICAL DATA:  Cough, shortness of breath. EXAM: CHEST - 2 VIEW COMPARISON:  August 03, 2005. FINDINGS: The heart size and mediastinal contours are within normal limits. Both lungs are clear. The visualized skeletal structures are unremarkable. IMPRESSION: No active cardiopulmonary disease. Electronically Signed   By: Lupita Raider M.D.   On: 04/01/2021 09:20    Procedures Procedures (including critical care time)  Medications Ordered in UC Medications - No data to display  Initial Impression / Assessment and Plan / UC Course  I have reviewed the triage vital signs and the nursing notes.  Pertinent labs & imaging results that were available during my care of the patient were reviewed by me and considered in my medical decision making (see chart for details).     1.  Acute bronchitis: Chest x-ray is negative for acute lung infiltrate Tessalon Perles as needed for cough Albuterol inhaler as needed for chest tightness Prednisone 20 mg orally daily for 5 days CBC Return to urgent care if symptoms worsen. No indication for flu testing or COVID testing given the duration of symptoms. Final Clinical Impressions(s) / UC Diagnoses   Final diagnoses:  Acute bronchitis, unspecified organism     Discharge Instructions      Please take  medications as prescribed Your chest x-ray is negative for pneumonia Return to urgent care if symptoms worsen    ED Prescriptions     Medication Sig Dispense Auth. Provider   benzonatate (TESSALON) 100 MG  capsule Take 1 capsule (100 mg total) by mouth 3 (three) times daily as needed for cough. 30 capsule Luma Clopper, Britta Mccreedy, MD   albuterol (VENTOLIN HFA) 108 (90 Base) MCG/ACT inhaler Inhale 2 puffs into the lungs every 4 (four) hours as needed for wheezing or shortness of breath. 18 g Alexius Ellington, Britta Mccreedy, MD   cetirizine-pseudoephedrine (ZYRTEC-D) 5-120 MG tablet Take 1 tablet by mouth daily. 30 tablet Sheridan Hew, Britta Mccreedy, MD   predniSONE (DELTASONE) 20 MG tablet Take 1 tablet (20 mg total) by mouth daily for 5 days. 5 tablet Eoghan Belcher, Britta Mccreedy, MD      PDMP not reviewed this encounter.   Merrilee Jansky, MD 04/01/21 1038

## 2021-10-07 IMAGING — CT CT ABD-PELV W/ CM
2 of 5 series · 15 of 46 positions shown, 17 images · IV contrast (APPLIED)
Comparison: Abdominal radiograph September 17, 2013

CLINICAL DATA: Diverticulitis suspected

EXAM:
CT ABDOMEN AND PELVIS WITH CONTRAST
TECHNIQUE: Multidetector CT imaging of the abdomen and pelvis was performed
using the standard protocol following bolus administration of
intravenous contrast.
CONTRAST:  100mL OMNIPAQUE IOHEXOL 300 MG/ML  SOLN

[Series 3: abd/ pelvis 5.0 i30f 2 · axial · 0.76mm/px · z∈[+853,+1263]mm · 12 of 92 slices shown, 14 images]
[im 5/92  soft-tissue]
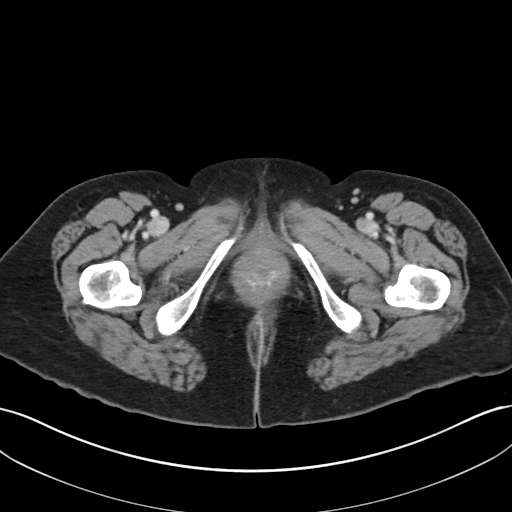
[im 5/92  bone]
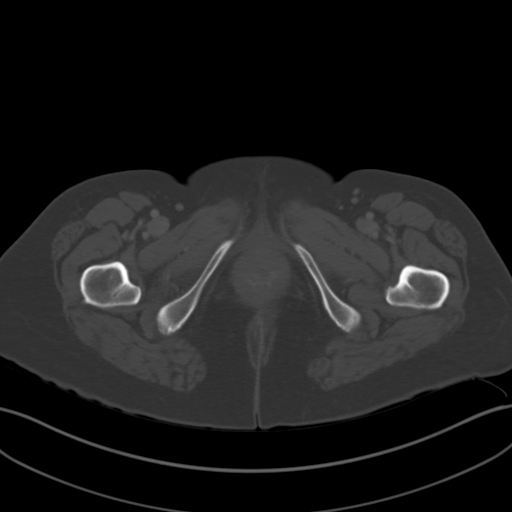
[im 14/92  soft-tissue]
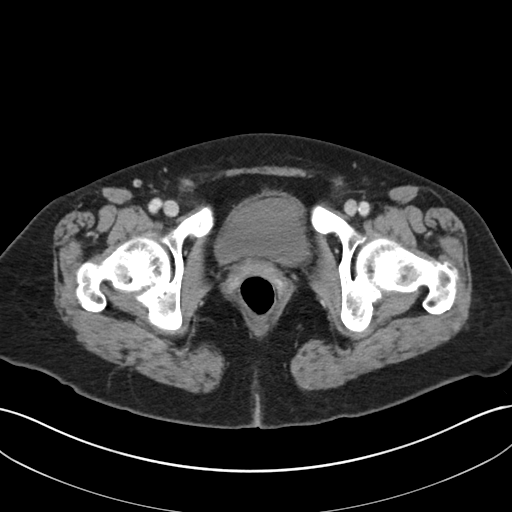
[im 19/92  soft-tissue]
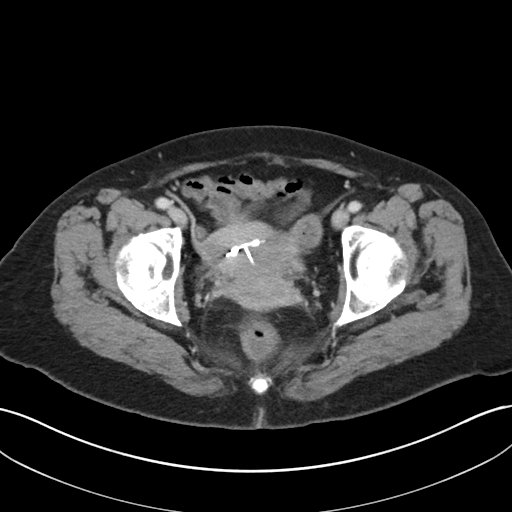
[im 28/92  soft-tissue]
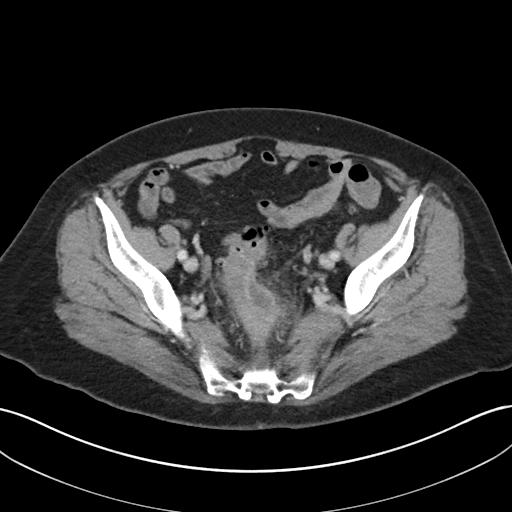
[im 37/92  soft-tissue]
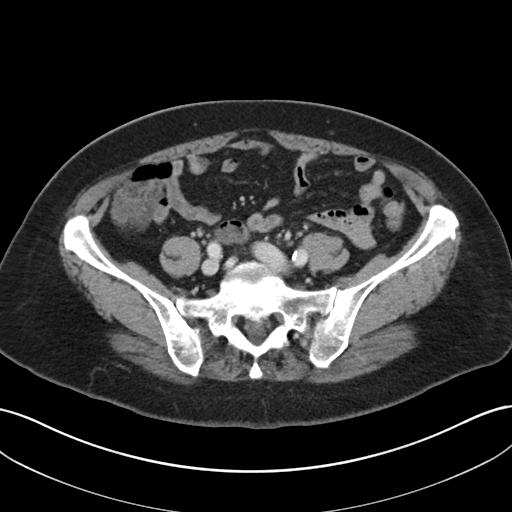
[im 41/92  soft-tissue]
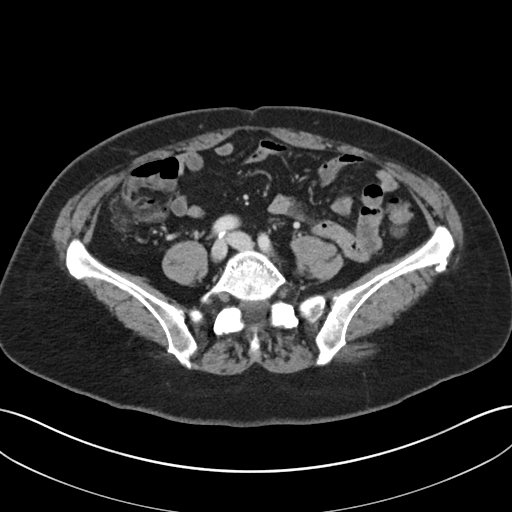
[im 51/92  soft-tissue]
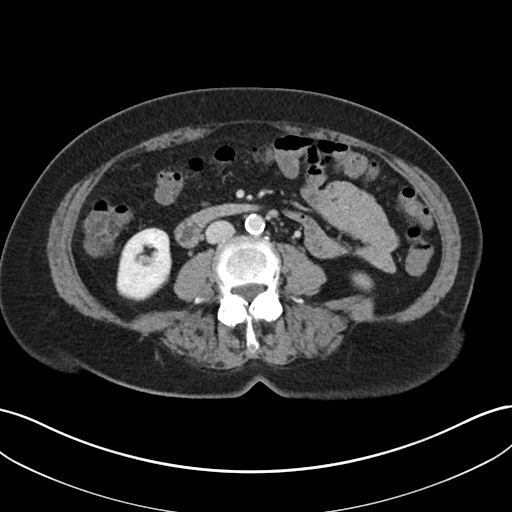
[im 55/92  soft-tissue]
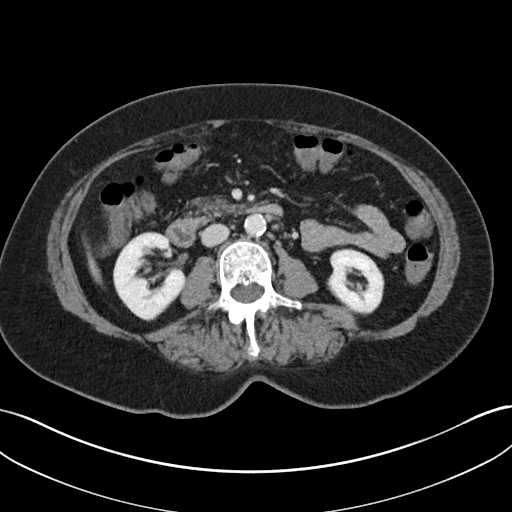
[im 64/92  soft-tissue]
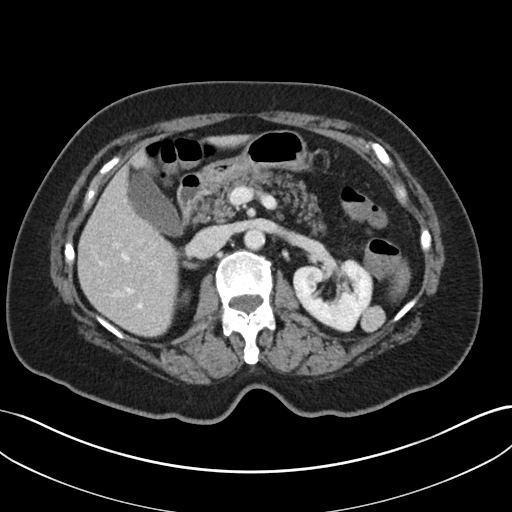
[im 64/92  bone]
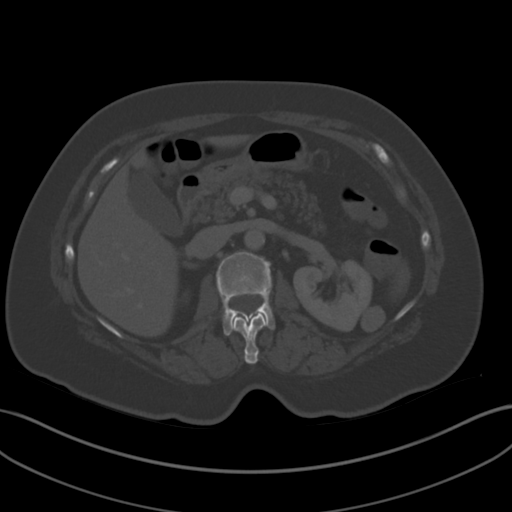
[im 73/92  soft-tissue]
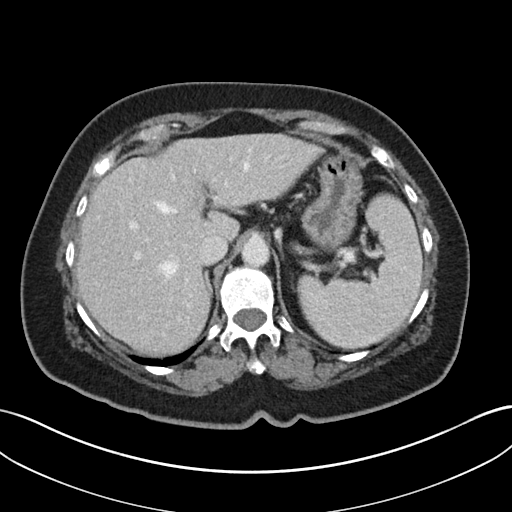
[im 78/92  soft-tissue]
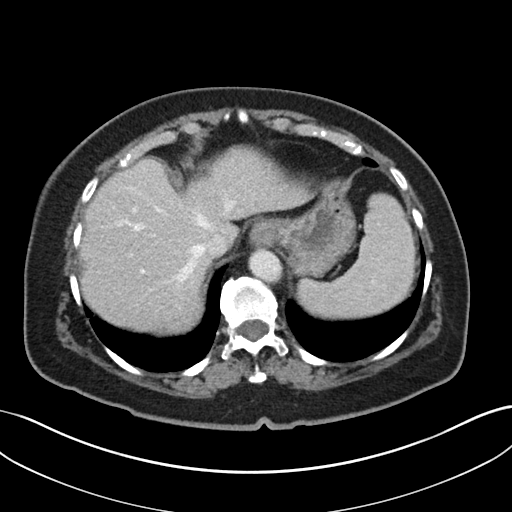
[im 87/92  soft-tissue]
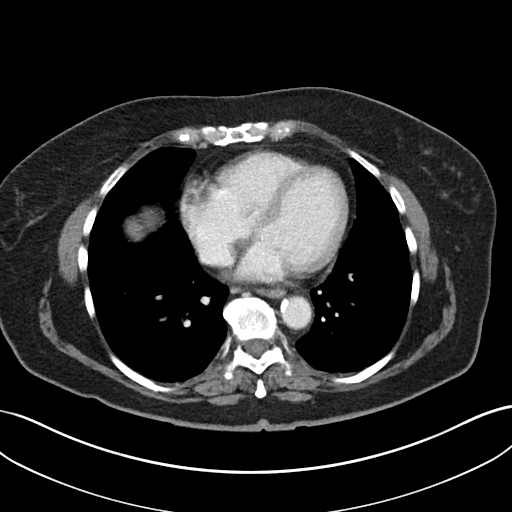

[Series 6: coronal soft tissue · coronal · 0.81mm/px · 3 of 101 slices shown]
[im 34/101  soft-tissue]
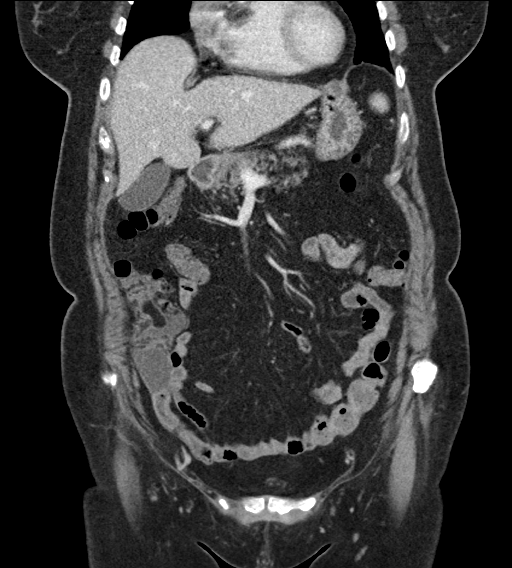
[im 45/101  soft-tissue]
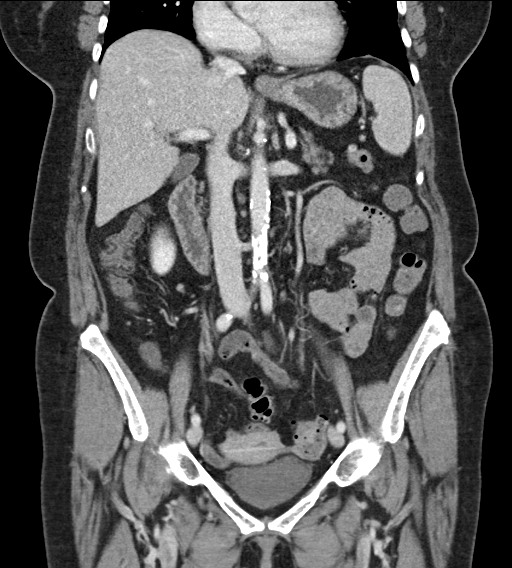
[im 56/101  soft-tissue]
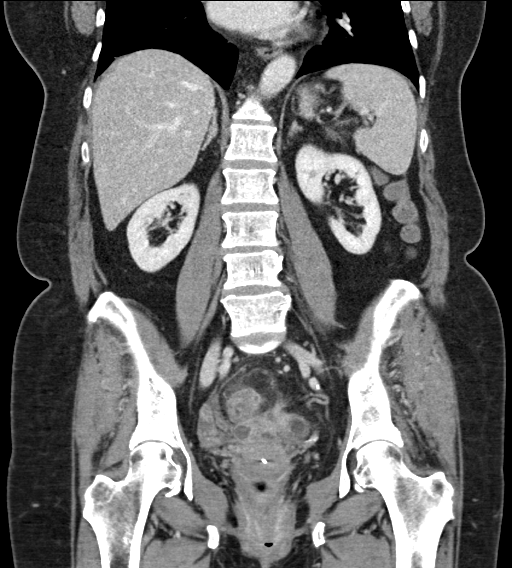

[15 of 46 positions shown; findings below may reference images not displayed]

FINDINGS: Lower chest: Lung bases are clear. Normal heart size. No pericardial
effusion.

Hepatobiliary: No focal liver abnormality is seen. No gallstones,
gallbladder wall thickening, or biliary dilatation.

Pancreas: Partial fatty replacement of the pancreas.

Spleen: Normal in size without focal abnormality. Small accessory
splenule.

Adrenals/Urinary Tract: Normal adrenal glands. Few subcentimeter
hypoattenuating foci in the kidneys too small to fully characterize
on CT imaging but statistically likely benign. Kidneys are otherwise
unremarkable, without renal calculi, suspicious lesion, or
hydronephrosis. Intramural fat noted within the anterior aspect of
the bladder wall, may reflect sequela of prior infection or
inflammation.

Stomach/Bowel: Distal esophagus, stomach and duodenal sweep are
unremarkable. No small bowel wall thickening or dilatation. No
evidence of obstruction. A normal appendix is visualized. Much of
the proximal colon is fluid-filled. There are numerous distal
colonic diverticula including a focally thickened segment of the mid
to distal sigmoid arising near the rectosigmoid junction is an
inflamed rim enhancing outpouching which may reflect an inflamed
colonic diverticula versus contained perforation/intramural abscess
which does demonstrate significant surrounding phlegmonous change as
well as loss of discernible fat plane with an adjacent loop of the
more proximal sigmoid colon as well as the dorsal aspect of the
uterus. No extraluminal gas is evident. No extraluminal free fluid
is evident.

Vascular/Lymphatic: Atherosclerotic plaque within the normal caliber
aorta. Reactive adenopathy noted in the deep pelvis in the region of
extensive pericolonic inflammation.

Reproductive: The anteverted uterus. Loss of discernible fat plane
of the posterior/dorsal uterine body and the adjacent inflammatory
changes of the sigmoid. No other concerning adnexal abnormality.

Other: No free fluid or free air. Extensive phlegmonous change in
the deep pelvis, as detailed above. No bowel containing hernias.

Musculoskeletal: Multilevel degenerative changes are present in the
imaged portions of the spine. No acute osseous abnormality or
suspicious osseous lesion.
IMPRESSION: 1. Rim enhancing collection arising from the distal sigmoid just
proximal to the rectosigmoid junction with extensive surrounding
inflammatory/phlegmonous change. This may reflect a contained
perforated diverticulum though the extent of inflammatory changes is
quite pronounced and does result in reactive thickening and
inflammation of a more proximal loop of sigmoid colon as well as
loss of discernible fat planes with the posterior/dorsal aspect of
the uterus. Developing colo-colonic fistulization is not fully
excluded particularly in the absence of luminal contrast media. No
extraluminal gas or free fluid is evident. Given this complicated
appearance, surgical consultation may be warranted. Furthermore,
after resolution of acute symptoms direct visualization of the colon
is recommended.
2. Intramural fat within the anterior aspect of the bladder wall,
may reflect sequela of prior infection or inflammation, correlate
with patient's symptoms.
3. Aortic Atherosclerosis ([id number]-EQF.F).

These results were called by telephone at the time of interpretation
on 08/28/2019 at [DATE] to provider GYENI CANN , who verbally
acknowledged these results.

## 2022-02-09 ENCOUNTER — Telehealth: Payer: Self-pay

## 2022-02-09 NOTE — Patient Outreach (Signed)
  Care Coordination   02/09/2022 Name: Katie Blake MRN: [id number] DOB: [birth year]   Care Coordination Outreach Attempts:  An unsuccessful telephone outreach was attempted today to offer the patient information about available care coordination services as a benefit of their health plan.   Follow Up Plan:  Additional outreach attempts will be made to offer the patient care coordination information and services.   Encounter Outcome:  No Answer  Care Coordination Interventions Activated:  No   Care Coordination Interventions:  No, not indicated    Noreene Larsson RN, MSN, [facility]  Mobile: [phone]

## 2022-06-09 ENCOUNTER — Encounter: Payer: Self-pay | Admitting: Family Medicine

## 2022-06-09 ENCOUNTER — Ambulatory Visit (INDEPENDENT_AMBULATORY_CARE_PROVIDER_SITE_OTHER): Payer: Medicare Other | Admitting: Family Medicine

## 2022-06-09 VITALS — BP 120/72 | HR 77 | Temp 98.4°F | Ht 65.5 in | Wt 176.2 lb

## 2022-06-09 DIAGNOSIS — M7672 Peroneal tendinitis, left leg: Secondary | ICD-10-CM | POA: Diagnosis not present

## 2022-06-09 MED ORDER — DICLOFENAC SODIUM 75 MG PO TBEC: 75.0000 mg | DELAYED_RELEASE_TABLET | Freq: Two times a day (BID) | ORAL | 1 refills | Status: DC

## 2022-06-09 NOTE — Progress Notes (Signed)
Katie Dike T. Gracee Ratterree, MD, [facility] [place], [address]  Phone: [phone]  FAX: [phone]  Katie Blake - 72 y.o. female  MRN [id number]  Date of Birth: 04/27/1951  Date: 06/09/2022  PCP: Jearld Fenton, NP  Referral: Jearld Fenton, NP  Chief Complaint  Patient presents with   Ankle Pain    Left   Subjective:   Katie Blake is a 72 y.o. very pleasant female patient with Body mass index is 28.88 kg/m. who presents with the following:  L ankle pain: Pleasant patient with left-sided lateral ankle pain without any kind of specific injury.  She has not had any form of inversion or eversion injury.  This is a dull pain that has been present for 2 months.  She does have pain just anterior as well as posterior to the lateral malleolus.  There is no bruising or swelling.  She has no medial pain and no pain in the heel, midfoot, or forefoot.  Lateral ankle is hurting. No kind of injury at all.   Has been ongoing for a couple of months.   Peroneal tendonitis -   Review of Systems is noted in the HPI, as appropriate  Objective:   BP 120/72   Pulse 77   Temp 98.4 F (36.9 C) (Oral)   Ht 5' 5.5" (1.664 m)   Wt 176 lb 4 oz (79.9 kg)   SpO2 95%   BMI 28.88 kg/m   GEN: No acute distress; alert,appropriate. PULM: Breathing comfortably in no respiratory distress PSYCH: Normally interactive.   Left foot and ankle pain: Nontender at the medial and lateral malleolus.  Nontender at the tibia and fibula. Nontender at the talus, cuboid, cuneiforms.  Nontender on all metatarsal shafts, phalanges, MTP joints as well as IP joints of the phalanx.  She does have tenderness at the lateral ankle slightly anterior and wrapping around the malleolus.  Some tenderness with pushoff.  Laboratory and Imaging Data:  Assessment and Plan:     ICD-10-CM   1. Peroneal tendonitis of left lower extremity  M76.72       Acute on chronic peroneal tendinopathy, greater than 84-month course of symptoms.  I placed the patient in an ASO ankle brace, and she is going to do some oral Voltaren as well as topical Voltaren.  Also reviewed with her basic home rehab with a towel to strengthen the area as it calms down.  Medication Management during today's office visit: Meds ordered this encounter  Medications   diclofenac (VOLTAREN) 75 MG EC tablet    Sig: Take 1 tablet (75 mg total) by mouth 2 (two) times daily.    Dispense:  60 tablet    Refill:  1   Medications Discontinued During This Encounter  Medication Reason   Vitamin D, Ergocalciferol, (DRISDOL) 1.25 MG (50000 UNIT) CAPS capsule Completed Course   benzonatate (TESSALON) 100 MG capsule Completed Course   albuterol (VENTOLIN HFA) 108 (90 Base) MCG/ACT inhaler Completed Course   cetirizine-pseudoephedrine (ZYRTEC-D) 5-120 MG tablet Completed Course    Orders placed today for conditions managed today: No orders of the defined types were placed in this encounter.   Disposition: No follow-ups on file.  [employer] speech-to-text software was used for transcription in this dictation.  Possible transcriptional errors can occur using Editor, commissioning.   Signed,  Maud Deed. Hady Niemczyk, MD   Outpatient Encounter Medications as of 06/09/2022  Medication Sig   acetaminophen (TYLENOL) 500 MG tablet Take 1,000 mg by mouth daily as needed (for pain).   diclofenac (VOLTAREN) 75 MG EC tablet Take 1 tablet (75 mg total) by mouth 2 (two) times daily.   [DISCONTINUED] albuterol (VENTOLIN HFA) 108 (90 Base) MCG/ACT inhaler Inhale 2 puffs into the lungs every 4 (four) hours as needed for wheezing or shortness of breath.   [DISCONTINUED] benzonatate (TESSALON) 100 MG capsule Take 1 capsule (100 mg total) by mouth 3 (three) times daily as needed for cough.   [DISCONTINUED] cetirizine-pseudoephedrine (ZYRTEC-D) 5-120 MG tablet Take 1 tablet by mouth daily.    [DISCONTINUED] Vitamin D, Ergocalciferol, (DRISDOL) 1.25 MG (50000 UNIT) CAPS capsule Take 1 capsule (50,000 Units total) by mouth every 7 (seven) days.   No facility-administered encounter medications on file as of 06/09/2022.

## 2022-06-09 NOTE — Patient Instructions (Signed)
Set up a follow-up appointment for transfer of care appointment with Dr. Eliezer Lofts

## 2022-06-11 ENCOUNTER — Encounter: Payer: Self-pay | Admitting: Family Medicine

## 2022-08-04 ENCOUNTER — Other Ambulatory Visit: Payer: Self-pay | Admitting: Family Medicine

## 2022-08-04 DIAGNOSIS — Z1231 Encounter for screening mammogram for malignant neoplasm of breast: Secondary | ICD-10-CM

## 2022-11-18 ENCOUNTER — Emergency Department (HOSPITAL_COMMUNITY)
Admission: EM | Admit: 2022-11-18 | Discharge: 2022-11-18 | Disposition: A | Discharge status: Home / Self Care | Payer: Medicare Other | Attending: Emergency Medicine | Admitting: Emergency Medicine

## 2022-11-18 ENCOUNTER — Encounter (HOSPITAL_COMMUNITY): Payer: Self-pay

## 2022-11-18 DIAGNOSIS — T7840XA Allergy, unspecified, initial encounter: Secondary | ICD-10-CM

## 2022-11-18 DIAGNOSIS — T63444A Toxic effect of venom of bees, undetermined, initial encounter: Secondary | ICD-10-CM | POA: Diagnosis present

## 2022-11-18 MED ORDER — DIPHENHYDRAMINE HCL 25 MG PO CAPS
25.0000 mg | ORAL_CAPSULE | Freq: Once | ORAL | Status: AC
  Administered 2022-11-18: 25 mg via ORAL
  Filled 2022-11-18: qty 1

## 2022-11-18 NOTE — ED Provider Notes (Signed)
[facility] EMERGENCY DEPARTMENT AT [facility] Provider Note   CSN: [id number] Arrival date & time: 11/18/22  7829     History  Chief Complaint  Patient presents with   Insect Bite    Katie Blake is a 72 y.o. female.  HPI     This is a 72 year old female who presents with a bee sting.  Patient reports that she was stung twice on 7/3 around lunchtime.  She has 1 bee sting medial left ankle and 1 over the right lower calf.  She states she has had increased swelling.  Swelling worsened after she took a warm shower tonight.  She was concerned at the amount of swelling in her feet.  She has been taking a daily Claritin but has not taken any Benadryl.  Denies shortness of breath, airway compromise, other rash.  Home Medications Prior to Admission medications   Medication Sig Start Date End Date Taking? Authorizing Provider  acetaminophen (TYLENOL) 500 MG tablet Take 1,000 mg by mouth daily as needed (for pain).    [provider]  diclofenac (VOLTAREN) 75 MG EC tablet Take 1 tablet (75 mg total) by mouth 2 (two) times daily. 06/09/22   Copland, Karleen Hampshire, MD      Allergies    Augmentin [amoxicillin-pot clavulanate] and Sulfa antibiotics    Review of Systems   Review of Systems  Constitutional:  Negative for fever.  HENT:  Negative for trouble swallowing.   Respiratory:  Negative for shortness of breath.   Cardiovascular:  Positive for leg swelling.  Skin:  Negative for rash.  All other systems reviewed and are negative.   Physical Exam Updated Vital Signs BP (!) 161/97 (BP Location: Right Arm)   Pulse 79   Temp 98.9 F (37.2 C) (Oral)   Resp 18   SpO2 94%  Physical Exam Vitals and nursing note reviewed.  Constitutional:      Appearance: She is well-developed. She is not ill-appearing.  HENT:     Head: Normocephalic and atraumatic.  Eyes:     Pupils: Pupils are equal, round, and reactive to light.  Cardiovascular:     Rate and Rhythm: Normal rate  and regular rhythm.     Heart sounds: Normal heart sounds.  Pulmonary:     Effort: Pulmonary effort is normal. No respiratory distress.     Breath sounds: No wheezing.  Abdominal:     Palpations: Abdomen is soft.  Musculoskeletal:     Cervical back: Neck supple.     Comments: Focused examination of the bilateral lower extremities with slight erythema noted over the medial left ankle, no significant warmth, no streaking, there is similar erythema noted over the posterior calf on the right, ankle and foot swelling noted, 2+ DP pulse  Skin:    General: Skin is warm and dry.  Neurological:     Mental Status: She is alert and oriented to person, place, and time.  Psychiatric:        Mood and Affect: Mood normal.     ED Results / Procedures / Treatments   Labs (all labs ordered are listed, but only abnormal results are displayed) Labs Reviewed - No data to display  EKG None  Radiology No results found.  Procedures Procedures    Medications Ordered in ED Medications  diphenhydrAMINE (BENADRYL) capsule 25 mg (has no administration in time range)    ED Course/ Medical Decision Making/ A&P  Medical Decision Making  This patient presents to the ED for concern of bee sting, this involves an extensive number of treatment options, and is a complaint that carries with it a high risk of complications and morbidity.  I considered the following differential and admission for this acute, potentially life threatening condition.  The differential diagnosis includes for reaction, anaphylaxis  MDM:    This is a 72 year old female who presents with increased swelling and redness at site of bee stings.  She is nontoxic and vital signs are reassuring.  No signs or symptoms of anaphylaxis.  Exam is consistent with localized reaction.  This is likely exacerbated by the fact that the bites are on her lower extremities which are more prone to swelling in the heat and  with gravity.  Recommend that she had Benadryl to her regimen and keep her feet elevated.  This will likely self resolve in the next 2 to 3 days.  (Labs, imaging, consults)  Labs: I Ordered, and personally interpreted labs.  The pertinent results include: None  Imaging Studies ordered: I ordered imaging studies including none I independently visualized and interpreted imaging. I agree with the radiologist interpretation  Additional history obtained from chart review.  External records from outside source obtained and reviewed including prior evaluations  Cardiac Monitoring: The patient was not maintained on a cardiac monitor.  If on the cardiac monitor, I personally viewed and interpreted the cardiac monitored which showed an underlying rhythm of: N/A  Reevaluation: After the interventions noted above, I reevaluated the patient and found that they have :stayed the same  Social Determinants of Health:  lives independently  Disposition: Discharge  Co morbidities that complicate the patient evaluation  Past Medical History:  Diagnosis Date   Diverticulitis      Medicines Meds ordered this encounter  Medications   diphenhydrAMINE (BENADRYL) capsule 25 mg    I have reviewed the patients home medicines and have made adjustments as needed  Problem List / ED Course: Problem List Items Addressed This Visit   None Visit Diagnoses     Bee sting, undetermined intent, initial encounter    -  Primary   Allergic reaction, initial encounter                       Final Clinical Impression(s) / ED Diagnoses Final diagnoses:  Bee sting, undetermined intent, initial encounter  Allergic reaction, initial encounter    Rx / DC Orders ED Discharge Orders     None         Juliahna Wiswell, Mayer Masker, MD 11/18/22 [phone]

## 2022-11-18 NOTE — ED Triage Notes (Signed)
Pt was stung by a bee on the 7/3, states that the sting seems to be worsening. Stings are located bilaterally on the right and le;ft ankle. Pt reports the swelling has increased since the time of the sting. Pt has not taken any benadryl but took her regular seasonal allergy medication. Also took tylenol for the pain. No of complaints, no shortness of breath or airway compromise. Otherwise appears stable in triage.

## 2022-11-18 NOTE — Discharge Instructions (Signed)
You were seen today after bee sting.  You have what is consistent with a localized reaction.  Because of the heat and where the stings are, you have developed some swelling.  Make sure to keep your feet elevated for the next 24 hours or so.  Add Benadryl every 6-8 hours as needed.  Continue your daily allergy medication.

## 2023-03-01 ENCOUNTER — Ambulatory Visit: Payer: Medicare Other | Admitting: Family Medicine

## 2023-11-06 ENCOUNTER — Encounter (HOSPITAL_COMMUNITY): Payer: Self-pay

## 2023-11-06 ENCOUNTER — Other Ambulatory Visit: Payer: Self-pay

## 2023-11-06 ENCOUNTER — Emergency Department (HOSPITAL_COMMUNITY)
Admission: EM | Admit: 2023-11-06 | Discharge: 2023-11-06 | Disposition: A | Discharge status: Home / Self Care | Attending: Emergency Medicine | Admitting: Emergency Medicine

## 2023-11-06 ENCOUNTER — Emergency Department (HOSPITAL_COMMUNITY)

## 2023-11-06 DIAGNOSIS — S0993XS Unspecified injury of face, sequela: Secondary | ICD-10-CM

## 2023-11-06 DIAGNOSIS — S0993XA Unspecified injury of face, initial encounter: Secondary | ICD-10-CM | POA: Diagnosis not present

## 2023-11-06 DIAGNOSIS — Q670 Congenital facial asymmetry: Secondary | ICD-10-CM | POA: Diagnosis present

## 2023-11-06 LAB — BASIC METABOLIC PANEL WITH GFR
Anion gap: 15 (ref 5–15)
BUN: 15 mg/dL (ref 8–23)
CO2: 17 mmol/L — ABNORMAL LOW (ref 22–32)
Calcium: 9.1 mg/dL (ref 8.9–10.3)
Chloride: 108 mmol/L (ref 98–111)
Creatinine, Ser: 0.81 mg/dL (ref 0.44–1.00)
GFR, Estimated: >60 mL/min (ref >60)
Glucose, Bld: 100 mg/dL — ABNORMAL HIGH (ref 70–99)
Potassium: 3.8 mmol/L (ref 3.5–5.1)
Sodium: 140 mmol/L (ref 135–145)

## 2023-11-06 LAB — CBC
HCT: 46.9 % — ABNORMAL HIGH (ref 36.0–46.0)
Hemoglobin: 15.7 g/dL — ABNORMAL HIGH (ref 12.0–15.0)
MCH: 28.6 pg (ref 26.0–34.0)
MCHC: 33.5 g/dL (ref 30.0–36.0)
MCV: 85.4 fL (ref 80.0–100.0)
Platelets: 237 10*3/uL (ref 150–400)
RBC: 5.49 MIL/uL — ABNORMAL HIGH (ref 3.87–5.11)
RDW: 12.3 % (ref 11.5–15.5)
WBC: 5.1 10*3/uL (ref 4.0–10.5)
nRBC: 0 % (ref 0.0–0.2)

## 2023-11-06 NOTE — ED Notes (Signed)
 Neurology at bedside.

## 2023-11-06 NOTE — ED Provider Triage Note (Signed)
 Emergency Medicine Provider Triage Evaluation Note  Katie Blake , a 73 y.o. female  was evaluated in triage.  Pt complains of facial droop.  Review of Systems  Positive: Mild headache Negative: Numbness or weakness  Physical Exam  BP (!) 150/108   Pulse 90   Temp 98.4 F (36.9 C)   Resp 14   SpO2 94%  Left facial droop.  Mild.  No clear forehead involvement seen.  Face seems a little more pulled to the right side.  Medical Decision Making  Medically screening exam initiated at 10:48 AM.  Appropriate orders placed.  Katie Blake was informed that the remainder of the evaluation will be completed by another provider, this initial triage assessment does not replace that evaluation, and the importance of remaining in the ED until their evaluation is complete.  Patient with potentially mild right-sided facial droop.  Does have some baseline facial asymmetry.  Face seems a little more pulled to the right side.  Forehead seems symmetric.  No other neurodeficits seen.  Last normal was potentially the middle of last week.  Not a TNK candidate.  Not LVO positive.  Will get MRI to evaluate.     Patsey Lot, MD 11/06/23 1049

## 2023-11-06 NOTE — Discharge Instructions (Addendum)
 Your test results today were reassuring.  The asymmetry of your face seems to be mechanical rather than neurological.  Return to the emergency department for any new or worsening symptoms of concern.

## 2023-11-06 NOTE — ED Triage Notes (Signed)
 Pt was at home getting a routine health insurance visit when the nurse told her she had a facial droop and needed to come here to rule out a stroke. Pt has no complaints, no recent illnesses, stroke screen negative

## 2023-11-06 NOTE — ED Provider Notes (Signed)
  EMERGENCY DEPARTMENT AT [facility] Provider Note   CSN: [id number] Arrival date & time: 11/06/23  1028     Patient presents with: Facial Droop   Katie Blake is a 73 y.o. female.   HPI Patient presents for concern of facial asymmetry.  Medical history includes migraines, HLD.  She was visited by a home health nurse today.  This is part of a yearly visit program that she is a part of.  Home health nurse was concerned about the asymmetry of her face.  She advised her to come to the ED.  Patient states that she noticed a change in the mirror 5 days ago.  She states that she has otherwise felt fine.    Prior to Admission medications   Medication Sig Start Date End Date Taking? Authorizing Provider  acetaminophen  (TYLENOL ) 500 MG tablet Take 1,000 mg by mouth daily as needed (for pain).    [provider]  diclofenac  (VOLTAREN ) 75 MG EC tablet Take 1 tablet (75 mg total) by mouth 2 (two) times daily. 06/09/22   Copland, Jacques, MD    Allergies: Augmentin  [amoxicillin -pot clavulanate] and Sulfa antibiotics    Review of Systems  Neurological:  Positive for facial asymmetry.  All other systems reviewed and are negative.   Updated Vital Signs BP (!) 150/85   Pulse 66   Temp 98.4 F (36.9 C)   Resp 16   SpO2 98%   Physical Exam Vitals and nursing note reviewed.  Constitutional:      General: She is not in acute distress.    Appearance: Normal appearance. She is well-developed. She is not ill-appearing, toxic-appearing or diaphoretic.  HENT:     Head: Normocephalic and atraumatic.     Right Ear: External ear normal.     Left Ear: External ear normal.     Nose: Nose normal.     Mouth/Throat:     Mouth: Mucous membranes are moist.   Eyes:     Extraocular Movements: Extraocular movements intact.     Conjunctiva/sclera: Conjunctivae normal.    Cardiovascular:     Rate and Rhythm: Normal rate and regular rhythm.  Pulmonary:     Effort:  Pulmonary effort is normal. No respiratory distress.  Abdominal:     General: There is no distension.     Palpations: Abdomen is soft.     Tenderness: There is no abdominal tenderness.   Musculoskeletal:        General: No swelling. Normal range of motion.     Cervical back: Normal range of motion and neck supple.   Skin:    General: Skin is warm and dry.     Coloration: Skin is not jaundiced or pale.   Neurological:     General: No focal deficit present.     Mental Status: She is alert and oriented to person, place, and time.     Sensory: No sensory deficit.     Motor: No weakness.     Coordination: Coordination normal.     Comments: Slight asymmetry of face.  Tongue deviates to the left side with extension.  Forehead is spared.  Psychiatric:        Mood and Affect: Mood normal.        Behavior: Behavior normal.     (all labs ordered are listed, but only abnormal results are displayed) Labs Reviewed  BASIC METABOLIC PANEL WITH GFR - Abnormal; Notable for the following components:      Result  Value   CO2 17 (*)    Glucose, Bld 100 (*)    All other components within normal limits  CBC - Abnormal; Notable for the following components:   RBC 5.49 (*)    Hemoglobin 15.7 (*)    HCT 46.9 (*)    All other components within normal limits    EKG: None  Radiology: MR BRAIN WO CONTRAST Result Date: 11/06/2023 CLINICAL DATA:  Facial paralysis/weakness (CN 7) EXAM: MRI HEAD WITHOUT CONTRAST TECHNIQUE: Multiplanar, multiecho pulse sequences of the brain and surrounding structures were obtained without intravenous contrast. COMPARISON:  None Available. FINDINGS: Brain: There is mild periventricular white matter disease present. The brain otherwise appears normal. There is no evidence of hemorrhage, mass, cortical infarct or hydrocephalus. There are no masses or inflammatory changes evident along the course of either seventh cranial nerve. Vascular: Normal vascular flow voids. Skull and  upper cervical spine: Normal marrow signal. No lesions are evident. Sinuses/Orbits: Negative. Other: None. IMPRESSION: 1. Mild periventricular white matter disease.  Otherwise, negative. Electronically Signed   By: Evalene Coho M.D.   On: 11/06/2023 14:51     Procedures   Medications Ordered in the ED - No data to display                                  Medical Decision Making  Patient presenting for concern of facial asymmetry.  On arrival, vital signs are normal.  She is well-appearing on exam.  She does have a slight facial asymmetry.  Right side of face seems a bit lower than the left.  When she extends her tongue, tongue does deviate to the left.  She has forehead sparing.  Symptoms are not consistent with a stroke.  With right-sided facial weakness, would expect right-sided tongue deviation.  She did undergo MRI which did not show any acute findings.  Given her age, I did consult neurology and Dr. Voncile evaluated her at bedside.  He feels this is clearly a mechanical issue.  Patient has reportedly had a prior injury to her jaw.  Patient was discharged in stable condition.     Final diagnoses:  Facial asymmetry    ED Discharge Orders     None          Melvenia Motto, MD 11/06/23 1816

## 2023-11-06 NOTE — ED Notes (Addendum)
 Pt reports tired of waiting for DC paperwork. Apologies made for wait times. Reinforced provider was about to DC.

## 2023-11-06 NOTE — ED Notes (Signed)
 Pt seen ambulating to the exit.

## 2023-11-06 NOTE — Consult Note (Signed)
 NEUROLOGY CONSULT NOTE   Date of service: November 06, 2023 Patient Name: Katie Blake MRN:  [id number] DOB:  [birth year] Chief Complaint: Right facial droop Requesting Provider: Melvenia Motto, MD  History of Present Illness  Katie Blake is a 73 y.o. female with no significant medical history except diverticulitis who presents to [facility] Ed for evaluation of a facial droop. She states she was having a home health visit and she was told by the nurse that she had a right facial droop and was advised to come to the Ed for evaluation to rule out stroke.  When told by her healthcare provider that her face was probably asymmetric, she did say that she felt that her face on the right side felt a little bit different the whole of last week.  It was not sudden in onset.  There was no change in taste.  There was no drooping or drooling of food or liquid.  No headaches.  No changes in her eyelids.  No changes in her vision-which baseline is not very good but no sudden change.  No diplopia.  No headaches.  No focal tingling numbness or weakness.  In the ED it was also felt that her tongue had a deviation to the left. MRI of the brain was completed which was negative Neurological consultation was obtained for opinion on stroke   LKW: at least 5 days ago  Modified rankin score: 0-Completely asymptomatic and back to baseline post- stroke IV Thrombolysis:  No not acute stroke  EVT:  No LVO  NIH stroke scale is 0 ROS  Comprehensive ROS performed and pertinent positives documented in HPI    Past History   Past Medical History:  Diagnosis Date   Diverticulitis     Past Surgical History:  Procedure Laterality Date    shoulder surgery  1988    rotator cuff, bilateral   TARSAL TUNNEL RELEASE     left    Family History: Family History  Problem Relation Age of Onset   Diabetes Mother    Cancer Father 60        brain tumor   Asthma Brother     Social History  reports that she has never smoked. She  has never used smokeless tobacco. She reports that she does not drink alcohol and does not use drugs.  Allergies  Allergen Reactions   Augmentin  [Amoxicillin -Pot Clavulanate] Other (See Comments)    Patient just don't want this prescribed for her.  She will not take due to son having a severe reaction to it   Sulfa Antibiotics     Medications  No current facility-administered medications for this encounter.  Current Outpatient Medications:    acetaminophen  (TYLENOL ) 500 MG tablet, Take 1,000 mg by mouth daily as needed (for pain)., Disp: , Rfl:    diclofenac  (VOLTAREN ) 75 MG EC tablet, Take 1 tablet (75 mg total) by mouth 2 (two) times daily., Disp: 60 tablet, Rfl: 1  Vitals   Vitals:   11/06/23 1038 11/06/23 1431 11/06/23 1432  BP: (!) 150/108 (!) 160/107   Pulse: 90 73   Resp: 14 17   Temp: 98.4 F (36.9 C)  98.3 F (36.8 C)  TempSrc:   Oral  SpO2: 94% 99%     There is no height or weight on file to calculate BMI.   Physical Exam   Constitutional: Appears well-developed and well-nourished.  Psych: Affect appropriate to situation.  Eyes: No scleral injection.  HENT: No OP obstruction.  Head: Normocephalic.  Cardiovascular: Normal rate and regular rhythm.  Respiratory: Effort normal, non-labored breathing.  GI: Soft.  No distension. There is no tenderness.  Skin: WDI.   Neurologic Examination   Mental Status -  Level of arousal and orientation to time, place, and person were intact. Language including expression, naming, repetition, comprehension was assessed and found intact. Attention span and concentration were normal. Recent and remote memory were intact. Fund of Knowledge was assessed and was intact.  Cranial Nerves II - XII - II - Visual field intact OU. III, IV, VI - Extraocular movements intact. V - Facial sensation intact bilaterally. VII -at baseline has asymmetric facies due to a right jaw fracture in her teenage VIII - Hearing & vestibular intact  bilaterally. X - Palate elevates symmetrically. XI - Chin turning & shoulder shrug intact bilaterally. XII - Tongue protrusion intact and nearly midline.  There is a mild leftward trajectory to the tongue protrusion but I think that is due to the mechanical course that the tongue takes to protrude given her prior jaw fracture.  Motor Strength - The patient's strength was normal in all extremities and pronator drift was absent.  Bulk was normal and fasciculations were absent .   Motor Tone - Muscle tone was assessed at the neck and appendages and was normal . Sensory - Light touch, temperature/pinprick were assessed and were symmetrical.   Coordination - The patient had normal movements in the hands and feet with no ataxia or dysmetria.  Tremor was absent. Gait and Station - deferred.  Labs/Imaging/Neurodiagnostic studies   CBC:  Recent Labs  Lab 11/25/23 1048  WBC 5.1  HGB 15.7*  HCT 46.9*  MCV 85.4  PLT 237   Basic Metabolic Panel:  Lab Results  Component Value Date   NA 140 2023/11/25   K 3.8 11/25/23   CO2 17 (L) 2023-11-25   GLUCOSE 100 (H) 11/25/23   BUN 15 Nov 25, 2023   CREATININE 0.81 Nov 25, 2023   CALCIUM 9.1 11-25-23   GFRNONAA >60 2023-11-25   GFRAA >60 09/01/2019   Lipid Panel:  Lab Results  Component Value Date   LDLCALC 176 (H) 09/26/2019    MRI Brain(Personally reviewed): Mild periventricular white matter disease.    ASSESSMENT   Katie Blake is a 73 y.o. female with no significant medical history who presents to the ED for evaluation of a right facial droop noted by a nurse doing a home visit.  Further questioning reveals that she has had a jaw injury during teenage years and her face is somewhat asymmetric at baseline. Irrespective, to rule out a stroke, brain MRI was performed which is negative for acute stroke. I do not think this is an MRI negative brainstem infarct. I think that the appearance of her right face being a little asymmetric is  just the progression of how her old jaw injury is progressing.  Impression: Question of right facial droop, likely sequela of old facial injury   RECOMMENDATIONS  No further workup inpatient Follow-up with outpatient neurology if facial asymmetry remains of concern. No acute stroke at this time.  No suspicion for MRI negative stroke as well.  D/W  with Dr. Melvenia ______________________________________________________________________    Signed, Karna DELENA Geralds, NP [facility]  Attending Neurohospitalist Addendum Patient seen and examined with APP/Resident. Agree with the history and physical as documented above. Agree with the plan as documented, which I helped formulate. I have independently reviewed the chart, obtained history, review of systems and examined  the patient.I have personally reviewed pertinent head/neck/spine imaging (CT/MRI). Please feel free to call with any questions.  -- Eligio Lav, MD Neurologist [facility] Pager: [phone]

## 2023-11-06 NOTE — ED Notes (Signed)
 Pt reports sent here for a facial droop. No speech issues. No unilateral or bilateral weakness. Reports last week she thought her face looked a little different but didn't really think twice about it. Denies dizziness/headache. Awaiting plan of care via provider. Denies needs.

## 2024-01-18 ENCOUNTER — Ambulatory Visit: Payer: Self-pay

## 2024-01-18 NOTE — Telephone Encounter (Signed)
 FYI Only or Action Required?: Action required by provider: update on patient condition.  Patient was last seen in primary care on 06/09/2022 by Watt Mirza, MD.  Called Nurse Triage reporting Shoulder Pain and Neck Pain.  Symptoms began several weeks ago.  Interventions attempted: OTC medications: Tylenol .  Symptoms are: unchanged.  Triage Disposition: See PCP When Office is Open (Within 3 Days)  Patient/caregiver understands and will follow disposition?: Yes, but will wait   Copied from CRM #[id number]. Topic: Clinical - Red Word Triage >> Jan 18, 2024  9:45 AM Jasmin G wrote: [facility] that prompted transfer to Nurse Triage: Pt has been experiencing lots of pain in both shoulders, sides of neck and left ear, pain started a couple of weeks ago and has been gradually getting worse. Reason for Disposition  [1] MODERATE neck pain (e.g., interferes with normal activities) AND [2] present > 3 days  Answer Assessment - Initial Assessment Questions Additional info:  1) In the 1980's she had shoulder surgery to release muscle, this pain feels very similar.  2) Requests next available appointment with pcp. Scheduled 9/10    1. ONSET: When did the pain begin?      3 weeks ago 2. LOCATION: Where does it hurt?      Left sided neck just below her ear 3. PATTERN Does the pain come and go, or has it been constant since it started?      Constant, worse with movement  4. SEVERITY: How bad is the pain?  (Scale 0-10; or none or slight stiffness, mild, moderate, severe)     7/10 5. RADIATION: Does the pain go anywhere else, shoot into your arms?     Denies  6. CORD SYMPTOMS: Any weakness or numbness of the arms or legs?     Denies  7. CAUSE: What do you think is causing the neck pain?     Muscle 8. NECK OVERUSE: Any recent activities that involved turning or twisting the neck?     No  9. OTHER SYMPTOMS: Do you have any other symptoms? (e.g., headache, fever, chest pain,  difficulty breathing, neck swelling)     Bilateral shoulder pain-aching  Protocols used: Neck Pain or Stiffness-A-AH

## 2024-01-23 NOTE — Progress Notes (Addendum)
 "    Nabria Nevin T. Grahm Etsitty, MD, CAQ Sports Medicine [facility] [address] [place], [address]  Phone: [phone]  FAX: [phone]  Katie Blake - 73 y.o. female  MRN [id number]  Date of Birth: 1951/03/08  Date: 01/24/2024  PCP: Watt Mirza, MD  Referral: Watt Mirza, MD  Chief Complaint  Patient presents with   Neck Pain    C/o L-side neck pain, L ear pain and B shoulder pain- worsening. Started about 2 wks ago.    Subjective:   Katie Blake is a 73 y.o. very pleasant female patient with Body mass index is 28.7 kg/m. who presents with the following:  Discussed the use of AI scribe software for clinical note transcription with the patient, who gave verbal consent to proceed.  This is a patient who I have seen multiple times for various musculoskeletal injuries over the years.  Today she presents for evaluation of neck and shoulder pain. History of Present Illness Katie Blake is a 73 year old female who presents with swelling and pain in the left jaw area.  She has significant swelling and pain at the corner of her jaw, just below the left ear, which has been present for approximately three weeks. The swelling coincided with the onset of shoulder pain. She initially thought it might be related to her sleeping position, but the area became sore and has not improved. No dental issues, ear pain, or sinus problems are reported. She has very few teeth and none in the affected area. There is no history of fever, chills, insect bites, or trauma to the area. She is concerned because her father had a brain tumor.  She also experiences bilateral shoulder pain, which began around the same time as the jaw swelling, approximately three to four weeks ago. She has a history of bilateral rotator cuff repair in the late 1980s due to bone impingement and torn rotator cuffs. The current shoulder pain is similar to what she experienced before her previous  surgeries, with pain during movement and activities such as lifting, dressing, and showering. The right shoulder is more painful than the left. She denies any recent trauma or repetitive activities that could have exacerbated the pain.  She does not take any prescription medications currently, only over-the-counter medications. She recalls being prescribed an anti-inflammatory medication about two years ago for a short period.    Review of Systems is noted in the HPI, as appropriate  Objective:   BP 120/72   Pulse 76   Temp 98 F (36.7 C) (Temporal)   Ht 5' 5.5 (1.664 m)   Wt 175 lb 2 oz (79.4 kg)   SpO2 96%   BMI 28.70 kg/m   GEN: No acute distress; alert,appropriate. PULM: Breathing comfortably in no respiratory distress PSYCH: Normally interactive.     Physical Exam NECK: Left parotid gland enlarged and tender. MUSCULOSKELETAL: Right and left shoulders with limited motion and pain on movement.   Shoulder: R and L Inspection: No muscle wasting or winging Ecchymosis/edema: neg  AC joint, scapula, clavicle: NT Cervical spine: NT, full ROM Spurling's: neg ABNORMAL SIDE TESTED: Bilateral UNLESS OTHERWISE NOTED, THE CONTRALATERAL SIDE HAS FULL RANGE OF MOTION. Abduction: 5/5, LIMITED TO 125 DEGREES Flexion: 5/5, LIMITED TO 125 DEGNO ROM  IR, lift-off: 5/5. TESTED AT 90 DEGREES OF ABDUCTION, LIMITED TO 5 DEGREES ER at neutral:  5/5, TESTED AT 90 DEGREES OF ABDUCTION, LIMITED TO 25 DEGREES AC crossover and compression:  PAIN Drop Test: neg Empty Can: neg Supraspinatus insertion: NT Bicipital groove: NT ALL OTHER SPECIAL TESTING EQUIVOCAL GIVEN LOSS OF MOTION C5-T1 intact Sensation intact Grip 5/5    Laboratory and Imaging Data:  Assessment and Plan:     ICD-10-CM   1. Mass of left side of neck  R22.1 Basic metabolic panel with GFR    CBC with Differential/Platelet    Hepatic function panel    TSH    CT SOFT TISSUE NECK W CONTRAST    CANCELED: Basic metabolic  panel with GFR    CANCELED: CBC with Differential/Platelet    CANCELED: Hepatic function panel    CANCELED: TSH    CANCELED: CT SOFT TISSUE NECK W WO CONTRAST    CANCELED: CT PARATHYROID 4D NECK W WO CONTRAST    2. Parotid gland enlargement  K11.1 Basic metabolic panel with GFR    CBC with Differential/Platelet    Hepatic function panel    TSH    CT SOFT TISSUE NECK W CONTRAST    CANCELED: Basic metabolic panel with GFR    CANCELED: CBC with Differential/Platelet    CANCELED: Hepatic function panel    CANCELED: TSH    CANCELED: CT SOFT TISSUE NECK W WO CONTRAST    CANCELED: CT PARATHYROID 4D NECK W WO CONTRAST    3. Chronic pain of both shoulders  M25.511 DG Shoulder Right   G89.29 DG Shoulder Left   M25.512     4. Screening for diabetes mellitus  Z13.1 Hemoglobin A1c    CANCELED: Hemoglobin A1c    5. Hypercoagulable state (HCC)  D68.59 Factor 5 leiden    6. Adhesive capsulitis of right shoulder  M75.01     7. Adhesive capsulitis of left shoulder  M75.02      Total encounter time: 43 minutes. This includes total time spent on the day of encounter.  Multiple ongoing medical and orthopedic issues.  Additional independent reading of imaging as well as coordination of stat CT. Assessment & Plan Left parotid gland swelling and pain Swelling and pain in the left parotid gland for three weeks. Differential includes infection or tumor.  The mass appears quite large. - Order CT scan of the neck to evaluate for tumor or other abnormalities.  I changed the CT orders to this specific soft tissue CT sequence recommended by radiology. Please see addendum below  - Prescribe clindamycin  300 mg orally three times a day for 10 days for possible ball siliadenitis.  Bilateral chronic shoulder pain and frozen shoulder with history of bilateral rotator cuff repair.  Restriction of motion in all planes of movement is consistent with bilateral adhesive capsulitis. Pain worsened recently. Limited  range of motion and significant pain, especially in the right shoulder. - Order shoulder X-rays to assess for arthritis or other joint abnormalities.  Xrays: Shoulder series, R Indication: shoulder pain Findings: No evidence of occult fracture Mild glenohumeral arthritis AC joint: Mild to moderate arthropathy Impingement pathology: Acromion has the appearance of prior subacromial decompression. Electronically Signed  By: Jacques Schroeder, MD On: 01/24/2024  2:20 PM EDT   Xrays: Shoulder series, L Indication: shoulder pain Findings: No evidence of occult fracture Mild to moderate glenohumeral arthritis, mildly worse than the right side AC joint: Moderate AC arthritis  impingement pathology: Undersurface of the acromion has appearance of prior subacromial decompression Electronically Signed  By: Jacques Schroeder, MD On: 01/24/2024  2:20 PM EDT   Addendum: 01/25/24 8:16 PM  -On January 24, 2024, I ordered a  CT soft tissue neck at the time of the patient's office visit.  On the same date at 4 PM, I received a call from [facility], [facility], and they requested that I change to CT soft tissue neck to a CT parathyroid 4D soft tissue with and without contrast. - On January 25, 2024, I received a call from [facility] imaging, [facility] imaging, and they requested that I cancel the CT parathyroid 4D soft tissue scan that they instructed me to order yesterday.  At that time, they recommended that I change the scan to a CT soft tissue neck with contrast, which was my original order on January 24, 2024.   Medication Management during today's office visit: Meds ordered this encounter  Medications   clindamycin  (CLEOCIN ) 300 MG capsule    Sig: Take 1 capsule (300 mg total) by mouth 3 (three) times daily.    Dispense:  30 capsule    Refill:  0   Medications Discontinued During This Encounter  Medication Reason   diclofenac  (VOLTAREN ) 75 MG EC tablet Completed Course    Orders placed  today for conditions managed today: Orders Placed This Encounter  Procedures   DG Shoulder Right   DG Shoulder Left   CT SOFT TISSUE NECK W CONTRAST   Basic metabolic panel with GFR   CBC with Differential/Platelet   Hemoglobin A1c   Hepatic function panel   TSH   Factor 5 leiden    Disposition: Return in about 2 months (around 03/25/2024) for Complete physical exam.  [employer] speech-to-text software was used for transcription in this dictation.  Possible transcriptional errors can occur using Animal nutritionist.   Signed,  Jacques DASEN. Elaysia Devargas, MD   Outpatient Encounter Medications as of 01/24/2024  Medication Sig   clindamycin  (CLEOCIN ) 300 MG capsule Take 1 capsule (300 mg total) by mouth 3 (three) times daily.   acetaminophen  (TYLENOL ) 500 MG tablet Take 1,000 mg by mouth daily as needed (for pain).   [DISCONTINUED] diclofenac  (VOLTAREN ) 75 MG EC tablet Take 1 tablet (75 mg total) by mouth 2 (two) times daily.   No facility-administered encounter medications on file as of 01/24/2024.   "

## 2024-01-24 ENCOUNTER — Encounter: Payer: Self-pay | Admitting: Family Medicine

## 2024-01-24 ENCOUNTER — Ambulatory Visit: Payer: Self-pay | Admitting: Family Medicine

## 2024-01-24 ENCOUNTER — Telehealth: Payer: Self-pay

## 2024-01-24 ENCOUNTER — Ambulatory Visit: Admitting: Family Medicine

## 2024-01-24 ENCOUNTER — Ambulatory Visit (INDEPENDENT_AMBULATORY_CARE_PROVIDER_SITE_OTHER)
Admission: RE | Admit: 2024-01-24 | Discharge: 2024-01-24 | Disposition: A | Discharge status: Home / Self Care | Source: Ambulatory Visit | Attending: Family Medicine | Admitting: Family Medicine

## 2024-01-24 VITALS — BP 120/72 | HR 76 | Temp 98.0°F | Ht 65.5 in | Wt 175.1 lb

## 2024-01-24 DIAGNOSIS — M25512 Pain in left shoulder: Secondary | ICD-10-CM

## 2024-01-24 DIAGNOSIS — G8929 Other chronic pain: Secondary | ICD-10-CM

## 2024-01-24 DIAGNOSIS — M7502 Adhesive capsulitis of left shoulder: Secondary | ICD-10-CM

## 2024-01-24 DIAGNOSIS — D6859 Other primary thrombophilia: Secondary | ICD-10-CM

## 2024-01-24 DIAGNOSIS — M25511 Pain in right shoulder: Secondary | ICD-10-CM

## 2024-01-24 DIAGNOSIS — Z131 Encounter for screening for diabetes mellitus: Secondary | ICD-10-CM | POA: Diagnosis not present

## 2024-01-24 DIAGNOSIS — K111 Hypertrophy of salivary gland: Secondary | ICD-10-CM | POA: Diagnosis not present

## 2024-01-24 DIAGNOSIS — R221 Localized swelling, mass and lump, neck: Secondary | ICD-10-CM | POA: Diagnosis not present

## 2024-01-24 DIAGNOSIS — M7501 Adhesive capsulitis of right shoulder: Secondary | ICD-10-CM

## 2024-01-24 MED ORDER — CLINDAMYCIN HCL 300 MG PO CAPS: 300.0000 mg | ORAL_CAPSULE | Freq: Three times a day (TID) | ORAL | 0 refills | Status: AC

## 2024-01-24 NOTE — Telephone Encounter (Signed)
 I changed the CT order as requested, but I am not sure if I need to tell anyone at [facility]?  I couldn't reach anyone when I called them.  I ordered the Factor 5 Leidin and asked Terri to arrange the blood draw.

## 2024-01-24 NOTE — Telephone Encounter (Signed)
 Copied from CRM #[id number]. Topic: Clinical - Request for Lab/Test Order >> Jan 24, 2024  3:56 PM Rea BROCKS wrote: Reason for CRM: [facility] called [phone] ext [phone]  Patient is at imaging appointment and they need the order updated to CT Parathyroid for soft tissue 4D w/w/o contrast.

## 2024-01-24 NOTE — Telephone Encounter (Signed)
 Copied from CRM #[id number]. Topic: General - Other >> Jan 24, 2024  3:56 PM Frederich PARAS wrote: Reason for CRM: pt calling in , she says she was supposed to get a factor 5 test, at her apt today, she adv she was just in the office wants to know if she can go back a nd get it. Called [facility], they adv it would need to be ordered.   Please send order for factor 5 test for pt . Pt is also worried about having another copay.  Pt callback # is [phone]

## 2024-01-24 NOTE — Telephone Encounter (Signed)
 I have received this order, precerted and sent it to [facility].   They will contact patient to schedule.   Patient is aware of where to call to schedule her CT scan tomorrow morning.

## 2024-01-25 ENCOUNTER — Telehealth: Payer: Self-pay

## 2024-01-25 ENCOUNTER — Telehealth: Payer: Self-pay | Admitting: *Deleted

## 2024-01-25 NOTE — Addendum Note (Signed)
 Addended by: WATT MIRZA on: 01/25/2024 08:16 PM   Modules accepted: Orders

## 2024-01-25 NOTE — Telephone Encounter (Signed)
 Copied from CRM #[id number]. Topic: Clinical - Medication Question >> Jan 25, 2024  8:47 AM [name] wrote: Reason for CRM: clindamycin  (CLEOCIN ) 300 MG capsule insurance doesnt cover the full amount, wanted to know if there is a alternative for the medication cheaper .Katie Blake

## 2024-01-25 NOTE — Telephone Encounter (Signed)
 Copied from CRM #[id number]. Topic: Clinical - Request for Lab/Test Order >> Jan 24, 2024  4:43 PM Dedra B wrote: Reason for CRM: Sonny from [facility] called saying they received an order for CT scan parathyroid for neck with and without contrast. She needs pt order for CT scan changed to CT soft tissue neck with contrast. If any questions, contact Leslie at [phone] option 1 then option 4.

## 2024-01-25 NOTE — Telephone Encounter (Signed)
 Pt had OV with Dr Watt yesterday. Had x-rays done but lab reports pt did not get blood work done. Needs lab visit.   Lvm asking pt to call back. Plz schedule lab visit asap.

## 2024-01-25 NOTE — Telephone Encounter (Signed)
 Katie Blake,   Can you check on this?  CT soft tissue neck with contrast.  I originally ordered a CT soft tissue neck scan yesterday, but [facility] called me on the day of the encounter and instructed me to change this to a CT soft tissue 4D parathyroid scan, which I then ordered.  Today, I got another call from [facility] imaging instructing me to cancel the CT soft tissue 4D parathyroid scan that they had told me to order and change this to a CT soft tissue neck scan.

## 2024-01-25 NOTE — Telephone Encounter (Signed)
 Augmentin  would be a cheaper option, but the chart says that she cannot take this.  Without using insurance, and using a GoodRx coupon, it looks like clindamycin  would be around $20 at most local pharmacies.

## 2024-01-26 MED ORDER — DICLOFENAC SODIUM 75 MG PO TBEC: 75.0000 mg | DELAYED_RELEASE_TABLET | Freq: Two times a day (BID) | ORAL | 1 refills | Status: AC

## 2024-01-26 NOTE — Telephone Encounter (Signed)
 Left message to return call to our office.

## 2024-01-26 NOTE — Telephone Encounter (Signed)
 The patient was not called by our clinical staff.  I left a message on the machine for the patient.  Her chart notes that she is not open to taking Augmentin  -no allergy, so I think that would be reasonable to use instead, however if we do not use Augmentin  clindamycin  really is the best choice.  I looked up cost and with a GoodRx coupon clindamycin  should be $15-$20 for the entire prescription.  Opioids are not really appropriate for frozen shoulder.  Once her neck issue is resolved and there is clearly no infection we could use oral steroids or intra-articular injection for pain management.

## 2024-01-26 NOTE — Telephone Encounter (Signed)
 I spoke with patient this morning regarding her CT Scan and she is requesting a call back ASAP regarding her Abx  Patient is also requesting some sore of pain medicine. Tylenol  is not working. Pt states that Ibuprofen and Advil is not working.

## 2024-01-29 NOTE — Telephone Encounter (Signed)
 Unable to reach patient. Left voicemail to return call to our office.

## 2024-01-30 NOTE — Telephone Encounter (Signed)
 Left message to return call to our office.

## 2024-01-31 NOTE — Telephone Encounter (Signed)
 Left message for Katie Blake that there is only 2 antibiotics that Dr. Watt said would be appropriate for her treatment.  The clindamycin  which Dr. Watt recommended using a Good Rx coupon for or Augmentin  which she didn't want to try because her son had a reaction to it..  I ask that she call us  back to let us  know if she is willing to try the Augmentin  which may be cheaper.

## 2024-01-31 NOTE — Telephone Encounter (Unsigned)
 Copied from CRM #[id number]. Topic: Clinical - Prescription Issue >> Jan 30, 2024  4:09 PM Katie Blake wrote: Reason for CRM: Pt said the antibiotic she was prescribed was more expensive than the last. She would like to prescribed one that she doesn't have to pay out of pocket for.

## 2024-01-31 NOTE — Telephone Encounter (Signed)
 Left message for Katie Blake that she was suppose to have labs drawn the day of her appointment with Dr. Watt and we need her to come back to the office to have her blood drawn at her convenience.

## 2024-02-01 ENCOUNTER — Other Ambulatory Visit

## 2024-02-07 NOTE — Telephone Encounter (Signed)
 I called [facility] to follow up on  this order and they stated that the patient canceled her appt due to financial reasons  Cancel Rsn: Financial (Cancelled appt as pt did not want to pay)   They are no longer needing a new order for this patient as the patient has declined due to cost.   They are still going to update me on the correct order for documentation purposes.   FYI

## 2024-02-07 NOTE — Telephone Encounter (Signed)
 The patient is ultimately responsible for her own medical decision making.
